# Patient Record
Sex: Female | Born: 1950 | Race: White | Hispanic: No | Marital: Married | Smoking: Never smoker
Health system: Southern US, Community
[De-identification: ages and names within clinical notes are randomized; demographics above are authoritative.]

## PROBLEM LIST (undated history)

## (undated) DIAGNOSIS — T7840XA Allergy, unspecified, initial encounter: Secondary | ICD-10-CM

## (undated) DIAGNOSIS — M1990 Unspecified osteoarthritis, unspecified site: Secondary | ICD-10-CM

## (undated) DIAGNOSIS — E079 Disorder of thyroid, unspecified: Secondary | ICD-10-CM

## (undated) DIAGNOSIS — I10 Essential (primary) hypertension: Secondary | ICD-10-CM

## (undated) HISTORY — DX: Disorder of thyroid, unspecified: E07.9

## (undated) HISTORY — DX: Essential (primary) hypertension: I10

## (undated) HISTORY — DX: Unspecified osteoarthritis, unspecified site: M19.90

## (undated) HISTORY — DX: Allergy, unspecified, initial encounter: T78.40XA

---

## 1998-07-30 ENCOUNTER — Other Ambulatory Visit: Admission: RE | Admit: 1998-07-30 | Discharge: 1998-07-30 | Payer: Self-pay | Admitting: Obstetrics and Gynecology

## 2000-01-26 ENCOUNTER — Other Ambulatory Visit: Admission: RE | Admit: 2000-01-26 | Discharge: 2000-01-26 | Payer: Self-pay | Admitting: Obstetrics and Gynecology

## 2001-05-25 ENCOUNTER — Other Ambulatory Visit: Admission: RE | Admit: 2001-05-25 | Discharge: 2001-05-25 | Payer: Self-pay | Admitting: Obstetrics and Gynecology

## 2002-06-26 ENCOUNTER — Other Ambulatory Visit: Admission: RE | Admit: 2002-06-26 | Discharge: 2002-06-26 | Payer: Self-pay | Admitting: Obstetrics and Gynecology

## 2005-07-20 ENCOUNTER — Ambulatory Visit: Payer: Self-pay | Admitting: Unknown Physician Specialty

## 2006-03-10 ENCOUNTER — Emergency Department: Payer: Self-pay | Admitting: Emergency Medicine

## 2006-09-10 IMAGING — CR DG ELBOW COMPLETE 3+V*L*
1 series · 4 of 4 positions shown · non-contrast
Comparison: none

REASON FOR EXAM: Fall, left elbow pain
COMMENTS:

PROCEDURE:     DXR - DXR ELBOW LT COMP W/OBLIQUES  - March 10, 2006  [DATE]
RESULT:     Four views of the LEFT elbow show no fracture, dislocation or
other acute bony abnormality.

[Series 1: view not recorded · 0.17mm/px · 4 of 4 slices shown]
[im 1/4]
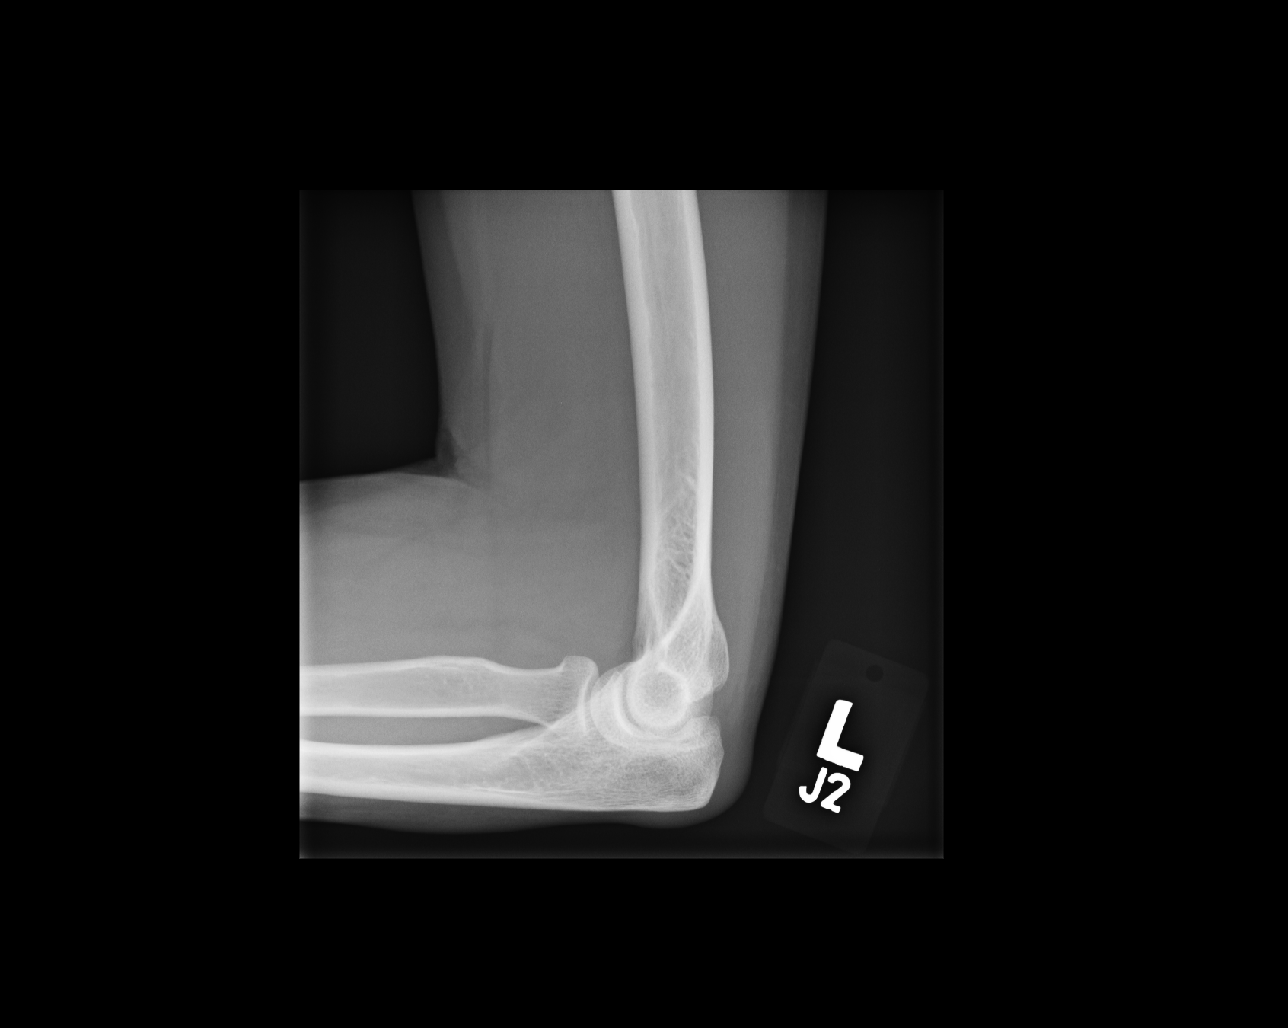
[im 2/4]
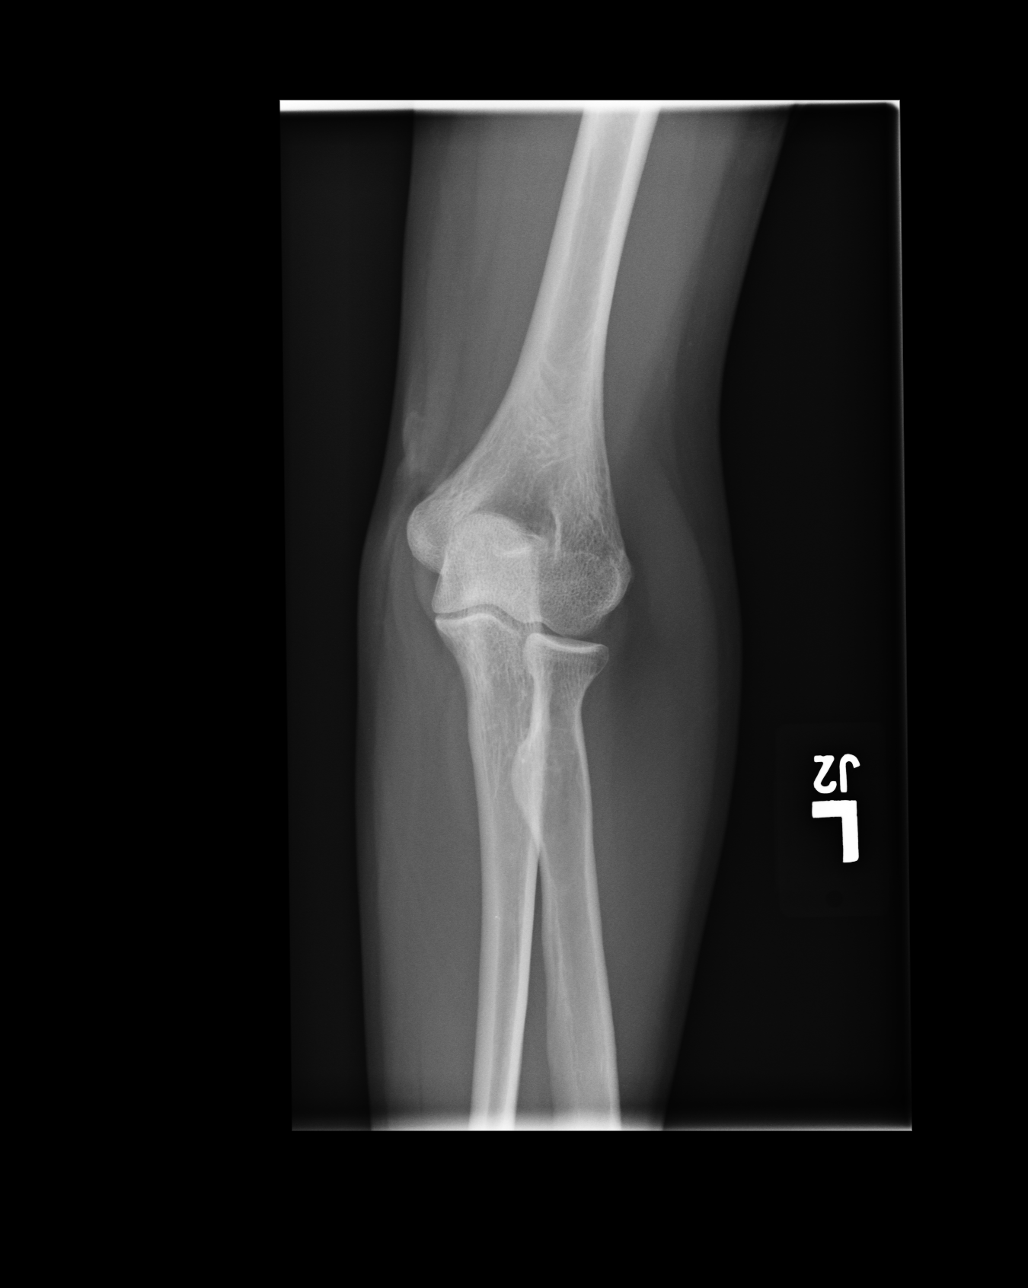
[im 3/4]
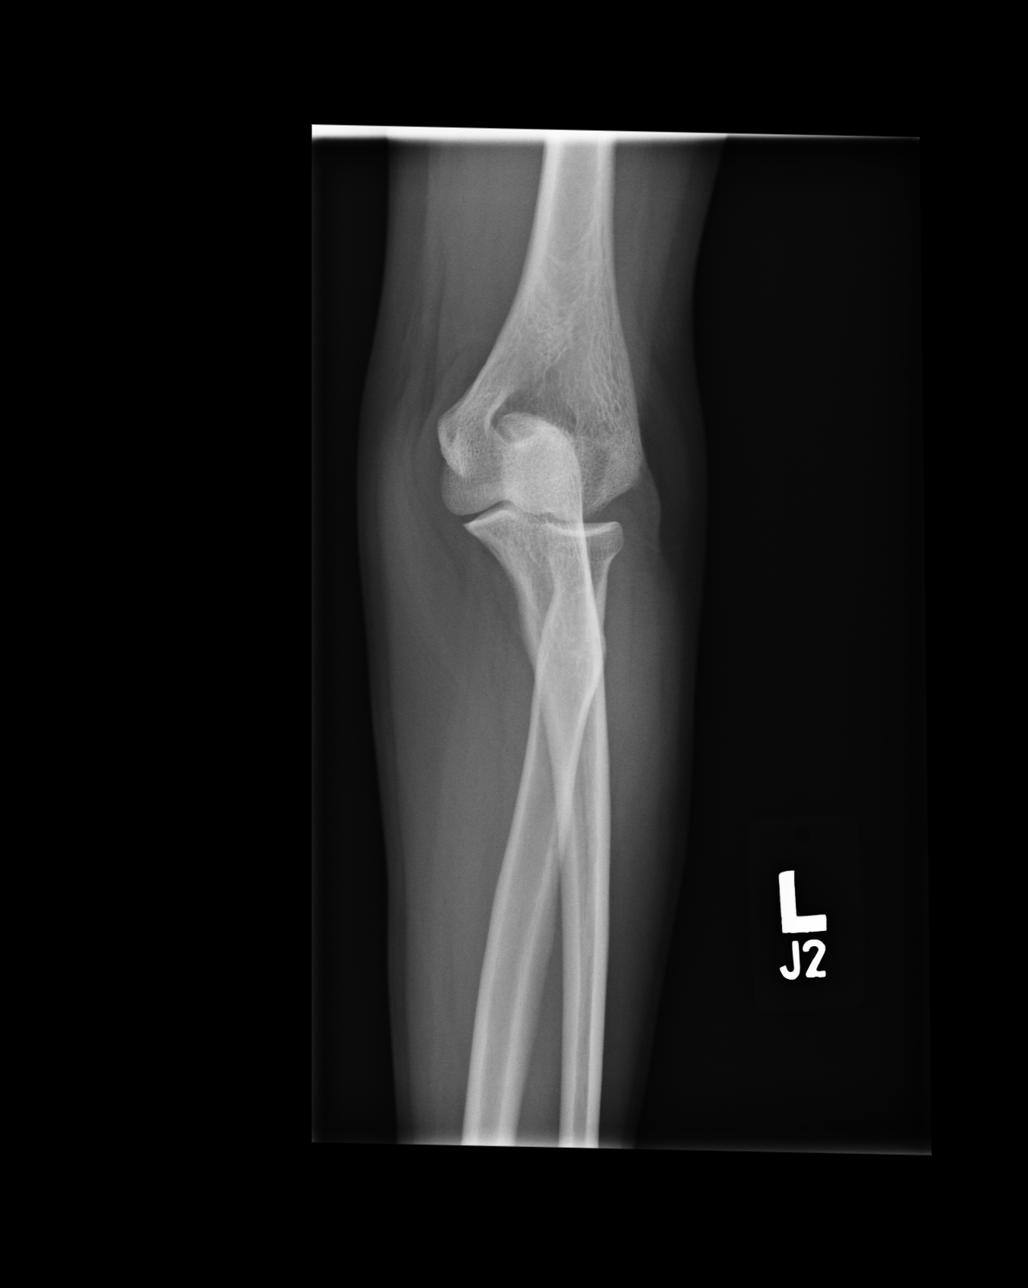
[im 4/4]
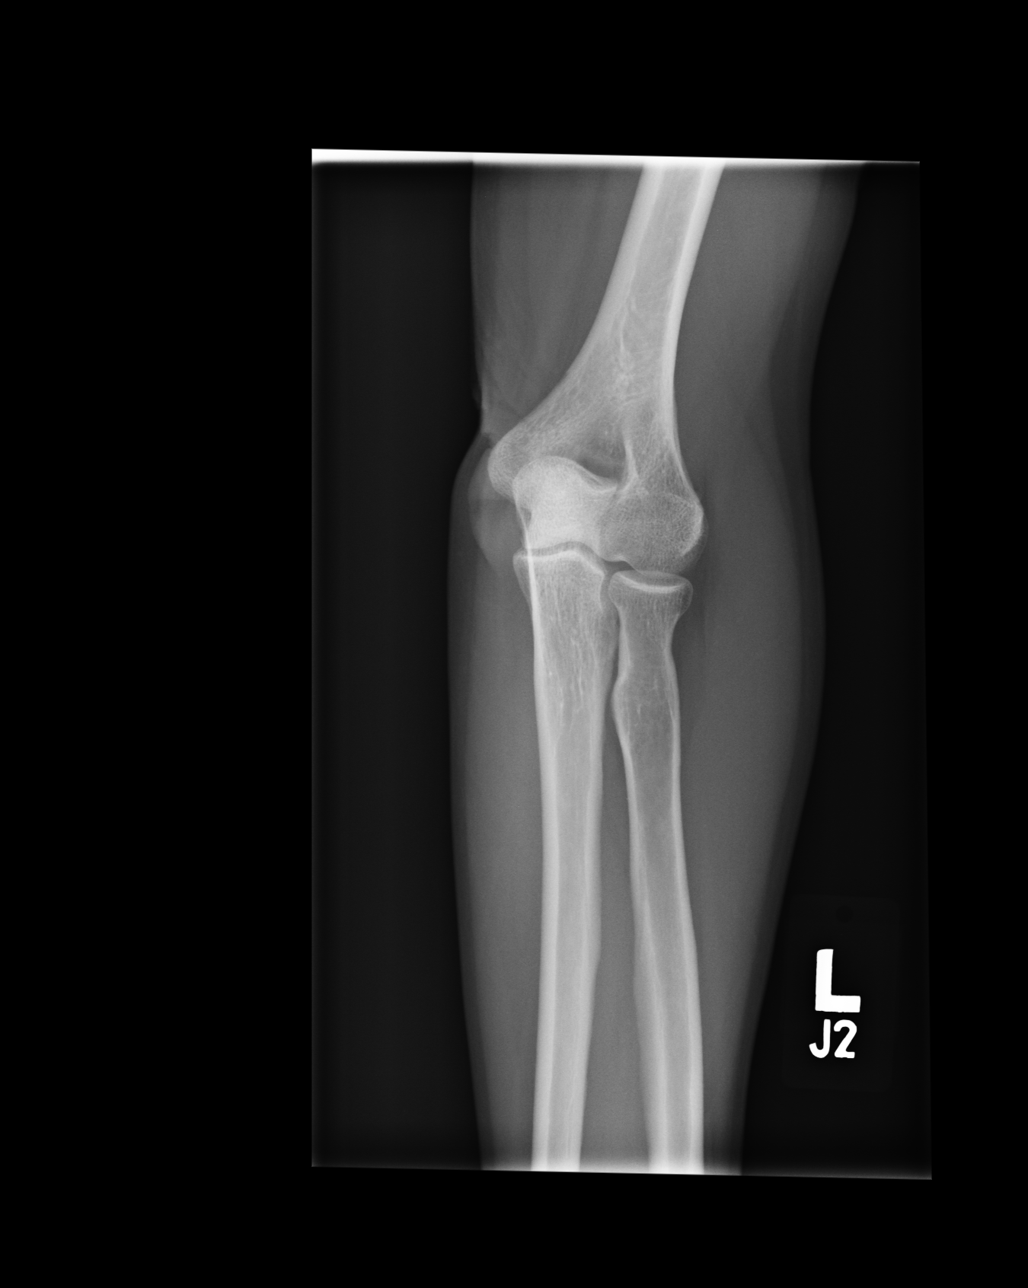

[4 of 4 positions shown; findings below may reference images not displayed]

IMPRESSION: No significant abnormalities are noted.

## 2006-09-10 IMAGING — CT CT MAXILLOFACIAL WITHOUT CONTRAST
2 series · 16 of 40 positions shown, 20 images · non-contrast
Comparison: none

REASON FOR EXAM: fall, pain, injury rm # 8
COMMENTS:

PROCEDURE:     CT  - CT MAXILLOFACIAL AREA WO  - March 10, 2006  [DATE]
RESULT:     An emergent CT scan was performed. The orbital rims and floors
appear intact. No acute fractures are noted. No fracture of the mandible is
seen. The zygomatic arches appear intact.  The sinuses appear clear.

[Series 2: facial 3.0 h60f · axial · 0.30mm/px · z∈[-162,-24]mm · 13 of 54 slices shown, 17 images]
[im 4/54  brain]
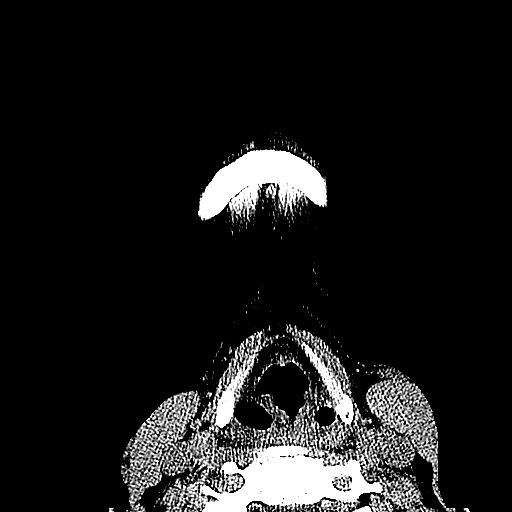
[im 4/54  bone]
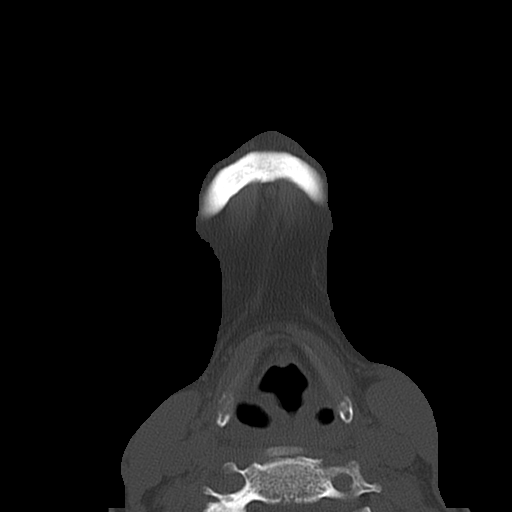
[im 8/54  bone]
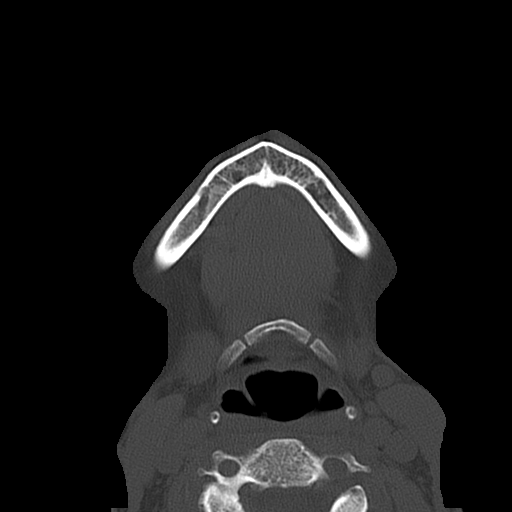
[im 11/54  bone]
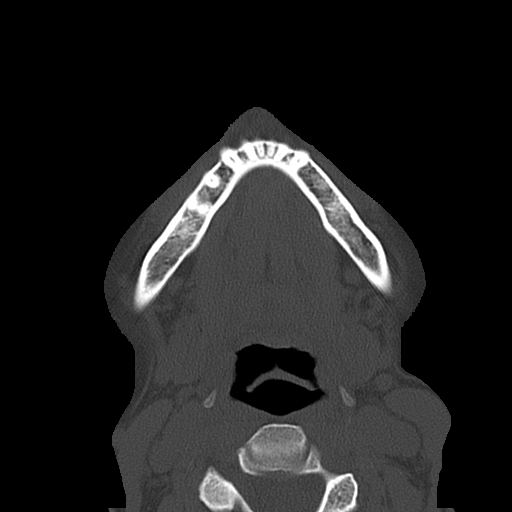
[im 15/54  bone]
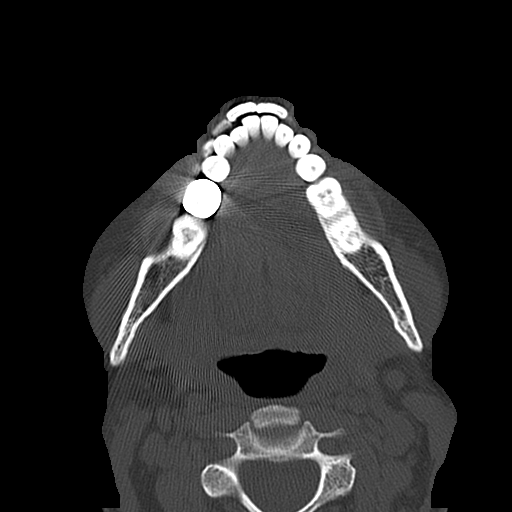
[im 19/54  brain]
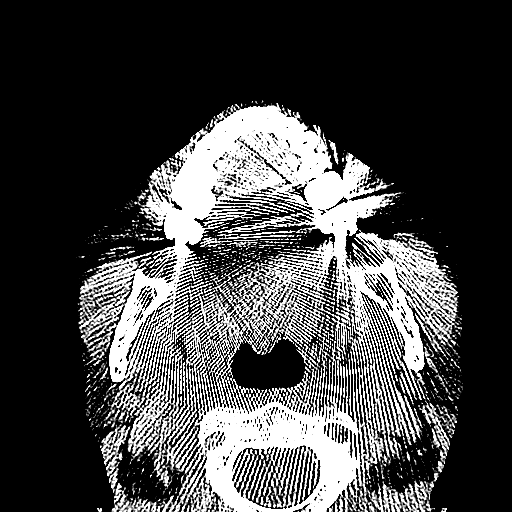
[im 19/54  bone]
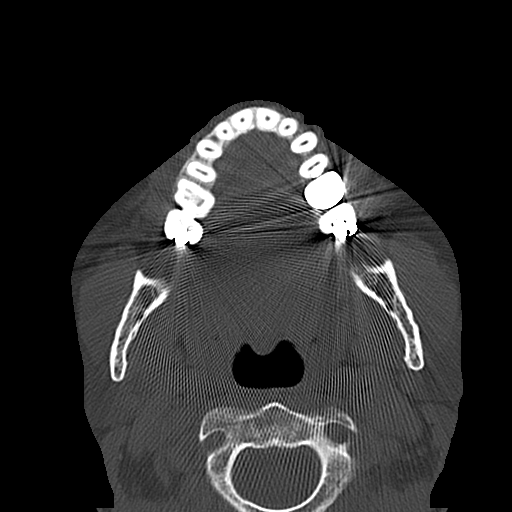
[im 22/54  bone]
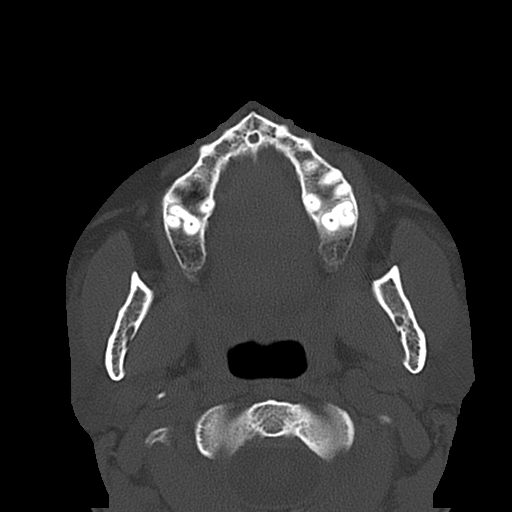
[im 28/54  bone]
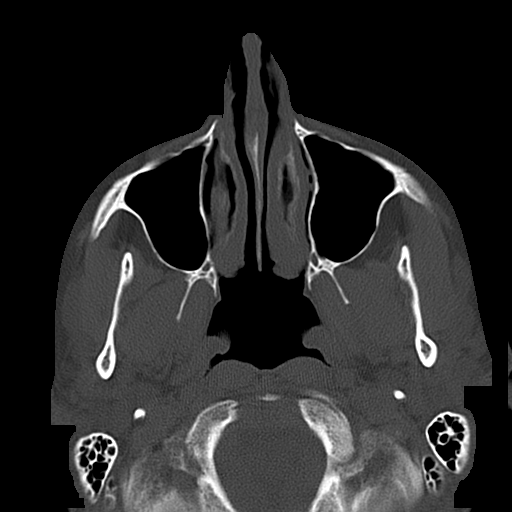
[im 32/54  bone]
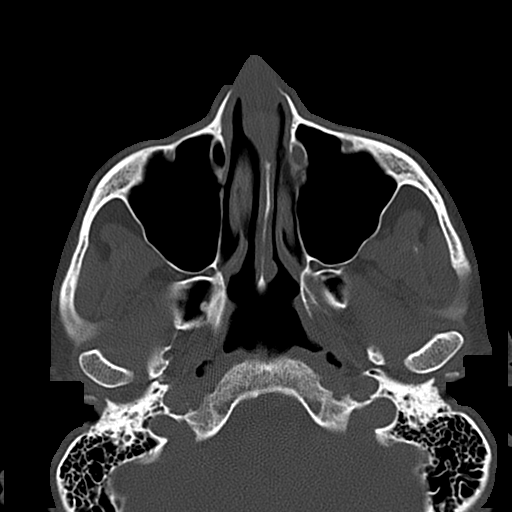
[im 35/54  brain]
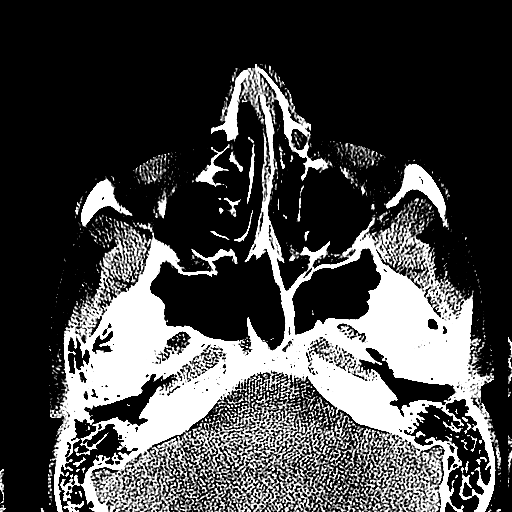
[im 35/54  bone]
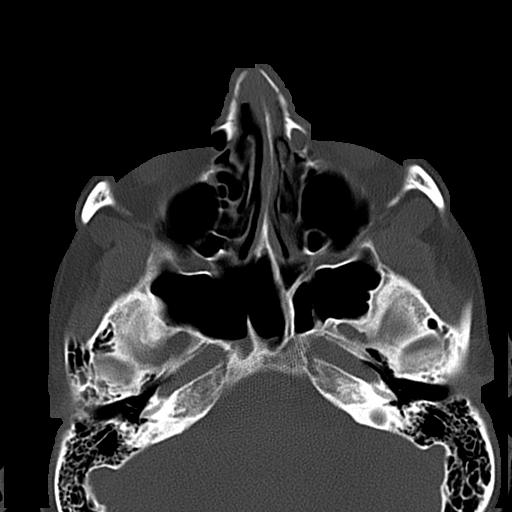
[im 39/54  bone]
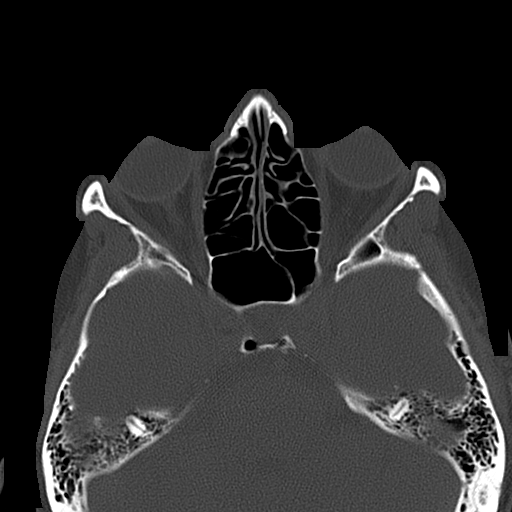
[im 43/54  bone]
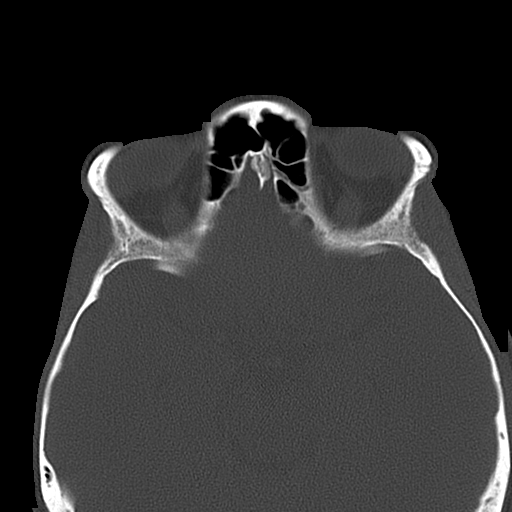
[im 46/54  bone]
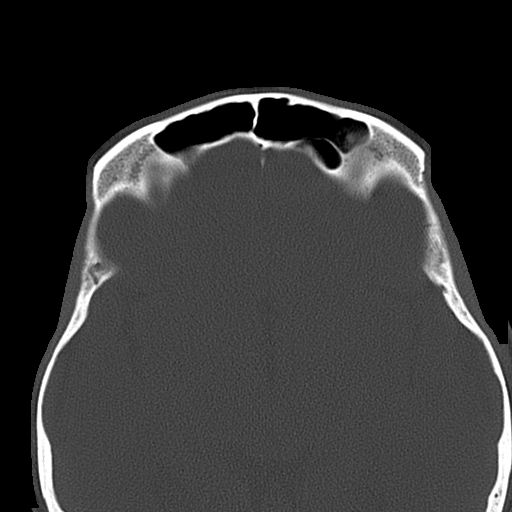
[im 50/54  brain]
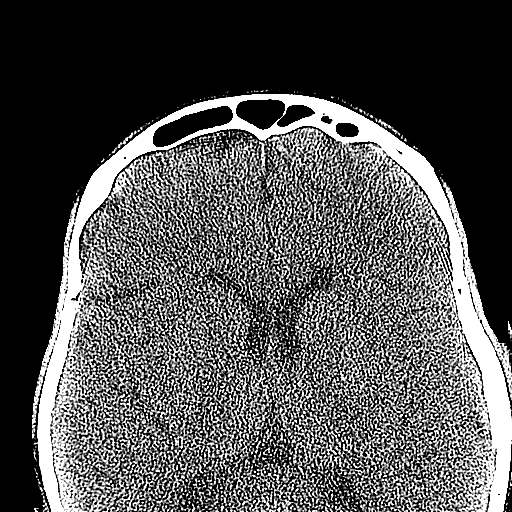
[im 50/54  bone]
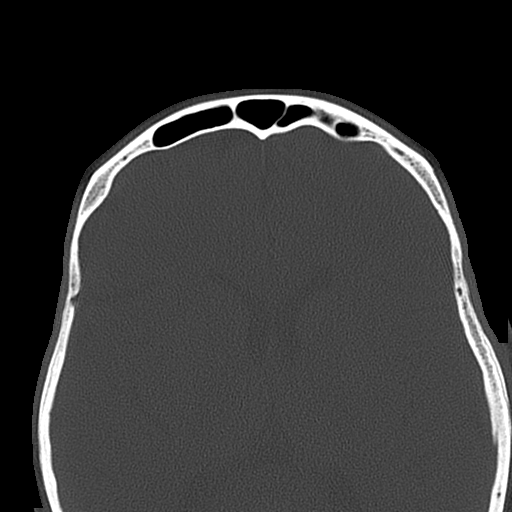

[Series 3: facial 3.0 spo · coronal · 0.33mm/px · 3 of 42 slices shown]
[im 14/42  bone]
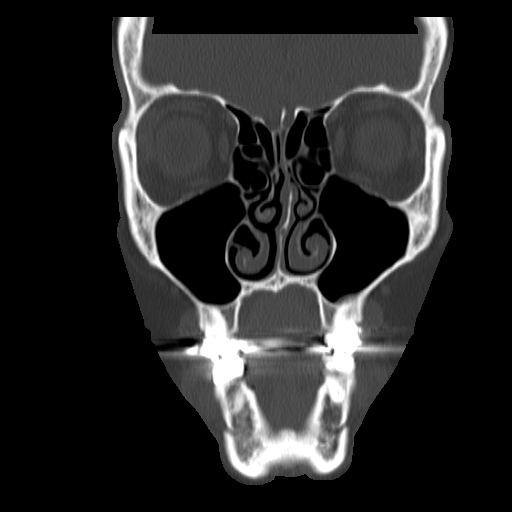
[im 19/42  bone]
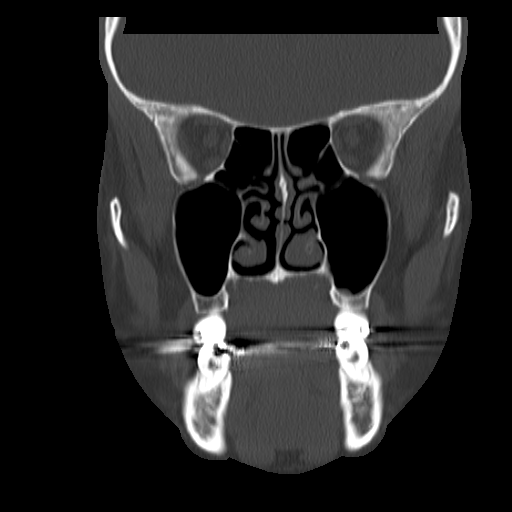
[im 23/42  bone]
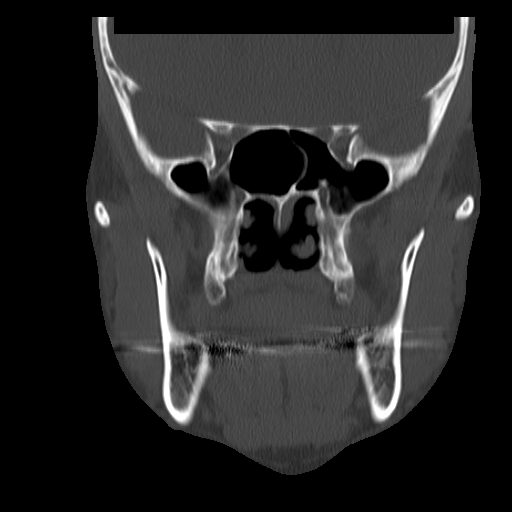

[16 of 40 positions shown; findings below may reference images not displayed]

IMPRESSION: 1)No acute fracture is noted on the facial CT.

This report was called to the [HOSPITAL] the conclusion of the
dictation.

## 2007-02-02 ENCOUNTER — Other Ambulatory Visit: Admission: RE | Admit: 2007-02-02 | Discharge: 2007-02-02 | Payer: Self-pay | Admitting: Obstetrics and Gynecology

## 2008-04-16 ENCOUNTER — Ambulatory Visit (HOSPITAL_BASED_OUTPATIENT_CLINIC_OR_DEPARTMENT_OTHER): Admission: RE | Admit: 2008-04-16 | Discharge: 2008-04-16 | Payer: Self-pay | Admitting: Obstetrics and Gynecology

## 2008-04-16 ENCOUNTER — Encounter: Payer: Self-pay | Admitting: Obstetrics and Gynecology

## 2008-04-25 ENCOUNTER — Ambulatory Visit: Payer: Self-pay | Admitting: Obstetrics and Gynecology

## 2008-04-27 ENCOUNTER — Ambulatory Visit: Payer: Self-pay | Admitting: Obstetrics and Gynecology

## 2008-05-02 ENCOUNTER — Ambulatory Visit: Payer: Self-pay | Admitting: Obstetrics and Gynecology

## 2009-03-05 ENCOUNTER — Ambulatory Visit: Payer: Self-pay | Admitting: Obstetrics and Gynecology

## 2009-03-05 ENCOUNTER — Encounter: Payer: Self-pay | Admitting: Obstetrics and Gynecology

## 2009-03-05 ENCOUNTER — Other Ambulatory Visit: Admission: RE | Admit: 2009-03-05 | Discharge: 2009-03-05 | Payer: Self-pay | Admitting: Obstetrics and Gynecology

## 2009-12-13 ENCOUNTER — Ambulatory Visit: Payer: Self-pay | Admitting: Unknown Physician Specialty

## 2010-12-30 NOTE — Op Note (Signed)
[name], [name]            ACCOUNT NO.:  [id number]   MEDICAL RECORD NO.:  [id number]          PATIENT TYPE:  AMB   LOCATION:  [facility]                         FACILITY:  [facility]   PHYSICIAN:  Daniel L. Gottsegen, M.D.DATE OF BIRTH:  1950/09/27   DATE OF PROCEDURE:  04/16/2008  DATE OF DISCHARGE:                               OPERATIVE REPORT   PREOPERATIVE DIAGNOSIS:  Postmenopausal bleeding, endometrial polyp,  possible submucous myoma.   POSTOPERATIVE DIAGNOSIS:  Postmenopausal bleeding, endometrial polyp,  possible submucous myoma.   SURGEON:  Dr. Eda Paschal   ANESTHESIA:  General.   OPERATIONS:  Hysteroscopy with excision of the intrauterine cavity  defects and endometrial sampling.   INDICATIONS:  The patient is a 60 year old gravida 2, para 2, AB 0 who  had presented at the office with postmenopausal bleeding.  A saline  infusion histogram was done the office.  There was a filling defect  coming off the anterior wall of the myoma and almost appeared as behind  the first one there was a second filling defect.  As a result of this  and her postmenopausal bleeding she comes into the hospital now for  hysteroscopy and excision of the above.   FINDINGS:  External is normal, BUS is normal.  Vaginal is normal.  Cervix is clean.  Uterus is normal size and shape with first-degree  descensus.  Adnexa failed to reveal masses.  At the time of  hysteroscopy, the patient had a very well-developed endometrial polyp  coming off the anterior wall of the fundus.  Once this was removed there  was a second mass behind it.  It appeared it either could be a firmer  endometrial polyp or possibly a small submucous myoma of about 2 cm.  Other than this, top of the fundus, tubal ostia, anterior posterior  walls of the fundus, lower uterine segment and endocervical canal were  free of disease.   PROCEDURE:  After adequate general anesthesia the patient was placed in  the dorsal lithotomy  position, prepped and draped usual sterile manner.  A single-tooth sac was placed in the anterior lip of the cervix.  The  cervix was dilated to #31 [facility] dilator and then a hysteroscopic  resectoscope was introduced without difficulty.  A 3% sorbitol was used  to expand the intrauterine cavity and a camera was used for  magnification and 90 degrees wire loop was attached with appropriate  Bovie settings.  First an endometrial polyp was encountered that was  removed in pieces with the resectoscope.  Then some endometrial  curettings were obtained as there was some buildup of endometrial from  the Megace the patient had been on.  Then the last mass which was 1-2 cm  was excised and the surgeon could not be sure  from the gross appearance  whether it was a myoma or another endometrial polyp that was firmer.  Following all this the cavity was completely clean.  There was some  bleeding points that were cauterized and the procedure was terminated.  Fluid deficit was 170 mL.  Blood loss was minimal.   The following specimens were sent  to pathology for tissue diagnosis.  1. Endometrial polyp.  2. Endometrial curettings.  3. Submucous myoma versus endometrial polyp.  The patient left the      operating room in satisfactory condition.      Daniel L. Eda Paschal, M.D.  Electronically Signed     DLG/MEDQ  D:  04/16/2008  T:  04/16/2008  Job:  [id number]

## 2011-12-22 LAB — HM PAP SMEAR: HM Pap smear: NORMAL

## 2012-01-05 ENCOUNTER — Ambulatory Visit: Payer: Self-pay

## 2012-06-23 ENCOUNTER — Encounter: Payer: Self-pay | Admitting: Internal Medicine

## 2012-06-23 ENCOUNTER — Ambulatory Visit (INDEPENDENT_AMBULATORY_CARE_PROVIDER_SITE_OTHER): Payer: BC Managed Care – PPO | Admitting: Internal Medicine

## 2012-06-23 VITALS — BP 140/80 | HR 62 | Temp 98.2°F | Ht 63.0 in | Wt 129.2 lb

## 2012-06-23 DIAGNOSIS — R1032 Left lower quadrant pain: Secondary | ICD-10-CM | POA: Insufficient documentation

## 2012-06-23 DIAGNOSIS — R1031 Right lower quadrant pain: Secondary | ICD-10-CM

## 2012-06-23 DIAGNOSIS — M25549 Pain in joints of unspecified hand: Secondary | ICD-10-CM | POA: Insufficient documentation

## 2012-06-23 DIAGNOSIS — E039 Hypothyroidism, unspecified: Secondary | ICD-10-CM

## 2012-06-23 DIAGNOSIS — F411 Generalized anxiety disorder: Secondary | ICD-10-CM | POA: Insufficient documentation

## 2012-06-23 DIAGNOSIS — F419 Anxiety disorder, unspecified: Secondary | ICD-10-CM | POA: Insufficient documentation

## 2012-06-23 MED ORDER — ZOSTER VACCINE LIVE 19400 UNT/0.65ML ~~LOC~~ SOLR: 0.6500 mL | Freq: Once | SUBCUTANEOUS | Status: DC

## 2012-06-23 NOTE — Assessment & Plan Note (Signed)
Symptoms of anxiety are well controlled with Zoloft and Xanax. We'll plan to continue.

## 2012-06-23 NOTE — Assessment & Plan Note (Signed)
Intermittent mild lower abdominal pain occurring typically twice per month. Exam is normal today. Recent Pap smear and colonoscopy were both normal. Question if this may be related to ovarian pathology. Recommended pelvic ultrasound for further evaluation. Patient would like to think about this but will call or e-mail she would like to proceed with ultrasound.

## 2012-06-23 NOTE — Assessment & Plan Note (Signed)
Will get records on previous TSH drawn with physical exam in May 2013. Continue Synthroid.

## 2012-06-23 NOTE — Progress Notes (Signed)
Subjective:    Patient ID: Sandra [name], female    DOB: 09-15-1950, 61 y.o.   MRN: [id number]  HPI 61 year old female with history of anxiety, hypothyroidism presents to establish care. She reports that she is generally feeling well. She reports that symptoms of anxiety have been ongoing for years and related to stress with medical issues of both of her sons, both who had ITP and her oldest who had bacterial meningitis. She continues to feel some anxiety related to their overall well-being and concern for their care. However, she feels that symptoms are well controlled with Zoloft and Xanax as needed.  In regards to her hypothyroidism, she reports that recent TSH drawn in May of 2013 was normal. She reports full compliance with her Synthroid.  She notes a several year history of pain in the joints of her hands. This is described as aching and stiffness which is most prominent in the mornings and then improves throughout the day. She has been taking Mobic for several years with some improvement. She denies any pain in other joints. She does occasionally have tingling in her thumb and first 2 fingers on both of her hands and this was diagnosed as carpal tunnel syndrome. She wears wrist brace at night with some improvement in this.  She also notes several month history of intermittent episodes of dull mild lower abdominal pain. This typically occurs twice per month and last about one day on each occurrence. It is not associated with dysuria, hematuria, fever, chills, abdominal cramping, diarrhea, constipation. She denies any nausea or vomiting. She had a recent Pap smear in May of 2013 which was normal. Her colonoscopy is also up to date and was normal. She has not taken any medication for this.  Outpatient Encounter Prescriptions as of 06/23/2012  Medication Sig Dispense Refill  . ALPRAZolam (XANAX) 0.25 MG tablet Take 1/2 to 1 tablet daily if needed for anxiety or insomia      . fluticasone  (FLONASE) 50 MCG/ACT nasal spray Place 2 sprays into the nose daily.      Marland Kitchen levothyroxine (SYNTHROID, LEVOTHROID) 88 MCG tablet Take 88 mcg by mouth daily.      Marland Kitchen losartan (COZAAR) 100 MG tablet Take 100 mg by mouth daily.      . meloxicam (MOBIC) 15 MG tablet Take 15 mg by mouth daily.      . sertraline (ZOLOFT) 50 MG tablet Take 50 mg by mouth daily.      Marland Kitchen zoster vaccine live, PF, (ZOSTAVAX) 40981 UNT/0.65ML injection Inject 19,400 Units into the skin once.  1 each  0   BP 140/80  Pulse 62  Temp 98.2 F (36.8 C) (Oral)  Ht 5\' 3"  (1.6 m)  Wt 129 lb 4 oz (58.627 kg)  BMI 22.90 kg/m2  SpO2 99%  Review of Systems  Constitutional: Negative for fever, chills, appetite change, fatigue and unexpected weight change.  HENT: Negative for ear pain, congestion, sore throat, trouble swallowing, neck pain, voice change and sinus pressure.   Eyes: Negative for visual disturbance.  Respiratory: Negative for cough, shortness of breath, wheezing and stridor.   Cardiovascular: Negative for chest pain, palpitations and leg swelling.  Gastrointestinal: Positive for abdominal pain. Negative for nausea, vomiting, diarrhea, constipation, blood in stool, abdominal distention and anal bleeding.  Genitourinary: Negative for dysuria and flank pain.  Musculoskeletal: Positive for arthralgias. Negative for myalgias and gait problem.  Skin: Negative for color change and rash.  Neurological: Negative for dizziness and headaches.  Hematological: Negative for adenopathy. Does not bruise/bleed easily.  Psychiatric/Behavioral: Negative for suicidal ideas, sleep disturbance and dysphoric mood. The patient is nervous/anxious.        Objective:   Physical Exam  Constitutional: She is oriented to person, place, and time. She appears well-developed and well-nourished. No distress.  HENT:  Head: Normocephalic and atraumatic.  Right Ear: External ear normal.  Left Ear: External ear normal.  Nose: Nose normal.    Mouth/Throat: Oropharynx is clear and moist. No oropharyngeal exudate.  Eyes: Conjunctivae normal are normal. Pupils are equal, round, and reactive to light. Right eye exhibits no discharge. Left eye exhibits no discharge. No scleral icterus.  Neck: Normal range of motion. Neck supple. No tracheal deviation present. No thyromegaly present.  Cardiovascular: Normal rate, regular rhythm, normal heart sounds and intact distal pulses.  Exam reveals no gallop and no friction rub.   No murmur heard. Pulmonary/Chest: Effort normal and breath sounds normal. No respiratory distress. She has no wheezes. She has no rales. She exhibits no tenderness.  Abdominal: Soft. Bowel sounds are normal. She exhibits no distension and no mass. There is no tenderness. There is no rebound and no guarding.  Musculoskeletal: Normal range of motion. She exhibits no edema and no tenderness.  Lymphadenopathy:    She has no cervical adenopathy.  Neurological: She is alert and oriented to person, place, and time. No cranial nerve deficit. She exhibits normal muscle tone. Coordination normal.  Skin: Skin is warm and dry. No rash noted. She is not diaphoretic. No erythema. No pallor.  Psychiatric: Her behavior is normal. Judgment and thought content normal. Her mood appears anxious.          Assessment & Plan:

## 2012-06-23 NOTE — Assessment & Plan Note (Signed)
Patient reports several year history of pain in the joints of her hands which is worse in the morning and then improves throughout the day. Also improves with use of Mobic. We discussed potential evaluation for rheumatoid arthritis given that symptoms and exam are most consistent with this. She would like to hold off and have labs performed with her physical exam in May of 2014.

## 2012-07-07 IMAGING — CR DG CHEST 2V
1 series · 2 of 2 positions shown · non-contrast
Comparison: none

REASON FOR EXAM: cough
COMMENTS:

[Series 1: pa · 0.17mm/px · 2 of 2 slices shown]
[im 1/2]
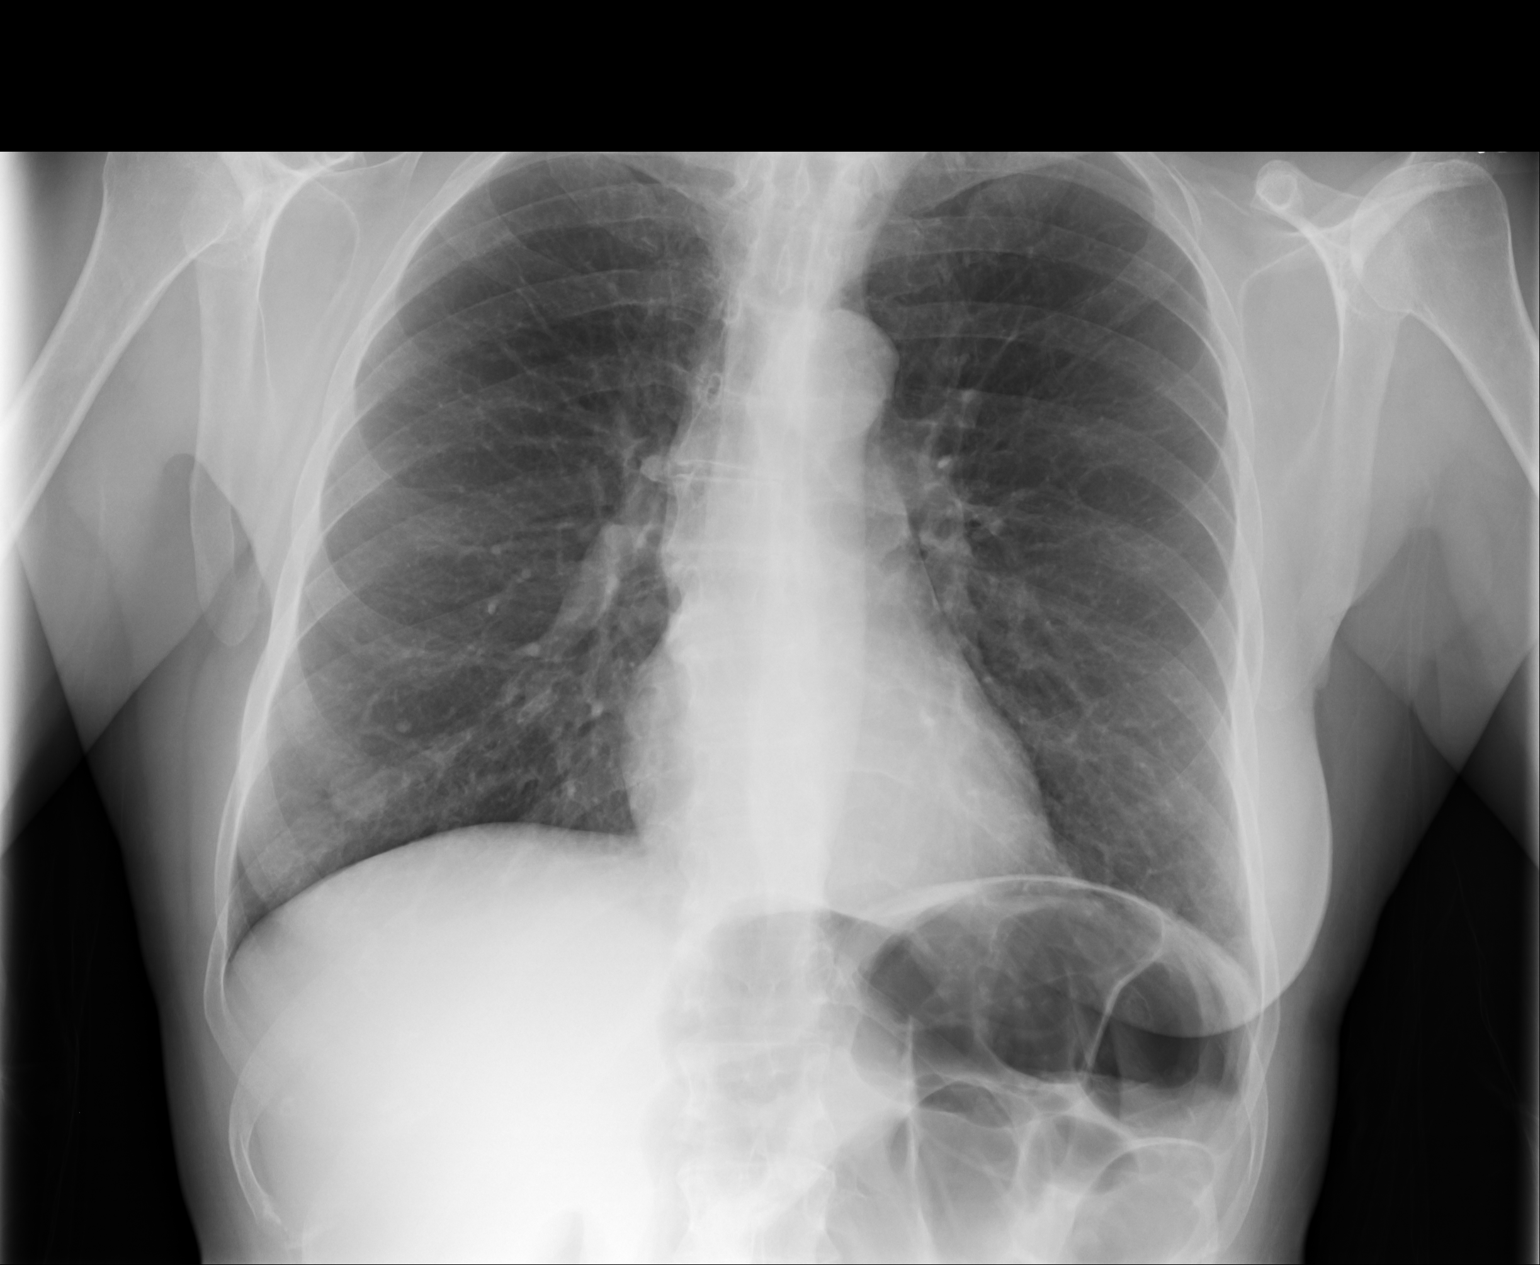
[im 2/2]
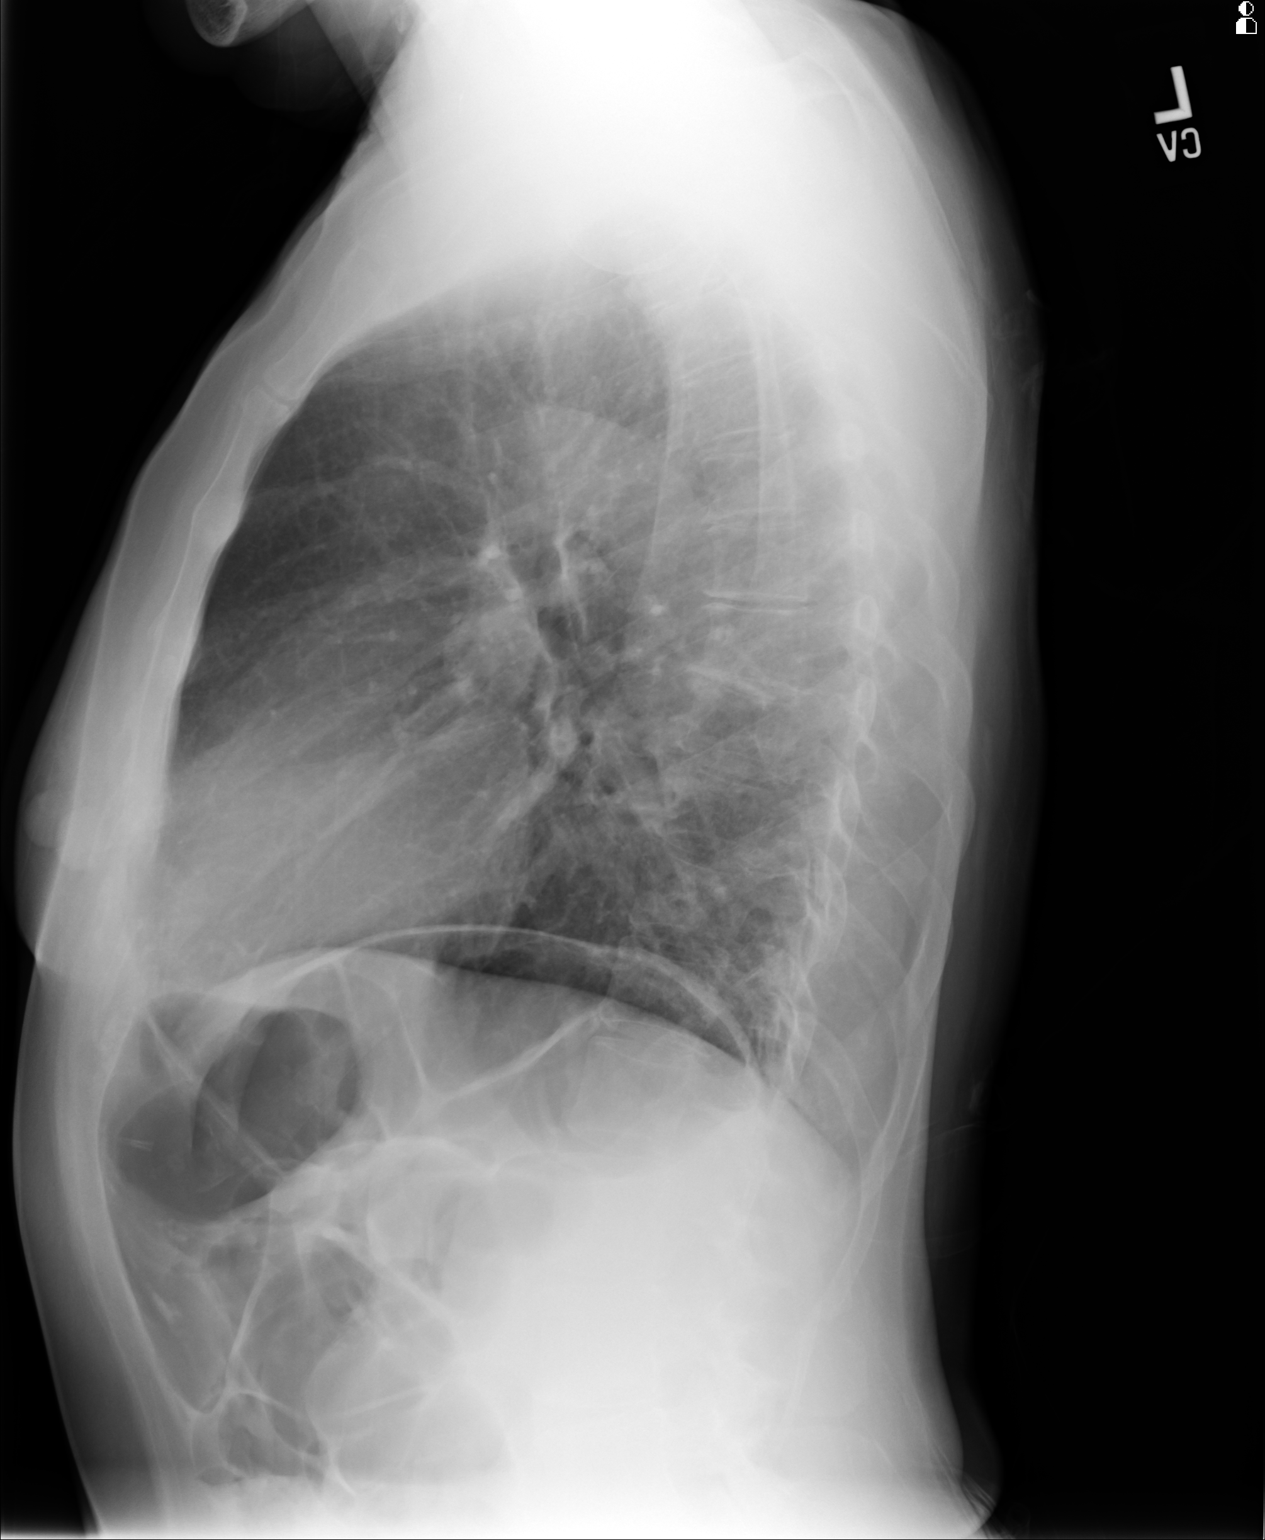

[2 of 2 positions shown; findings below may reference images not displayed]

PROCEDURE:     KDR - KDXR CHEST PA (OR AP) AND LAT  - January 05, 2012  [DATE]

RESULT:     There is a moderate amount of air within loops of what appear to
be colon under the left side of the diaphragm. If the patient has abdominal
symptoms further imaging may be beneficial.

The lungs are clear. The heart and pulmonary vessels are normal. The bony
and mediastinal structures are unremarkable. There is no effusion. There is
no pneumothorax or evidence of congestive failure.
IMPRESSION: No acute cardiopulmonary disease.

[REDACTED]

## 2012-08-30 ENCOUNTER — Other Ambulatory Visit: Payer: Self-pay | Admitting: Internal Medicine

## 2012-08-30 MED ORDER — LOSARTAN POTASSIUM 100 MG PO TABS: 100.0000 mg | ORAL_TABLET | Freq: Every day | ORAL | Status: DC

## 2012-08-30 NOTE — Telephone Encounter (Signed)
losartan (COZAAR) 100 MG tablet  # 30

## 2012-08-31 ENCOUNTER — Other Ambulatory Visit: Payer: Self-pay | Admitting: Family Medicine

## 2012-09-03 ENCOUNTER — Other Ambulatory Visit: Payer: Self-pay | Admitting: Family Medicine

## 2012-09-03 NOTE — Telephone Encounter (Signed)
Please pull paper chart.  

## 2012-09-05 ENCOUNTER — Other Ambulatory Visit: Payer: Self-pay | Admitting: Internal Medicine

## 2012-09-05 NOTE — Telephone Encounter (Addendum)
ALPRAZolam (XANAX) 0.25 MG tablet #30  losartan (COZAAR) 100 MG tablet #30  meloxicam (MOBIC) 15 MG tablet #30

## 2012-09-05 NOTE — Telephone Encounter (Signed)
Patient does not have a [facility] account. May be a  patient of Dr. Michaelle Copas.

## 2012-09-06 MED ORDER — LOSARTAN POTASSIUM 100 MG PO TABS: 100.0000 mg | ORAL_TABLET | Freq: Every day | ORAL | Status: DC

## 2012-09-06 MED ORDER — ALPRAZOLAM 0.25 MG PO TABS: ORAL_TABLET | ORAL | Status: DC

## 2012-09-06 MED ORDER — MELOXICAM 15 MG PO TABS: 15.0000 mg | ORAL_TABLET | Freq: Every day | ORAL | Status: DC

## 2012-09-08 NOTE — Telephone Encounter (Signed)
Rx called to pharmacy as instructed. 

## 2012-09-30 ENCOUNTER — Other Ambulatory Visit: Payer: Self-pay | Admitting: Family Medicine

## 2012-10-03 ENCOUNTER — Other Ambulatory Visit: Payer: Self-pay | Admitting: *Deleted

## 2012-10-03 NOTE — Telephone Encounter (Signed)
Patient has not been seen in an year, please advise

## 2012-10-03 NOTE — Telephone Encounter (Signed)
May refill x 1 month. Needs a follow up visit for further refills.

## 2012-10-04 MED ORDER — SERTRALINE HCL 50 MG PO TABS: 50.0000 mg | ORAL_TABLET | Freq: Every day | ORAL | Status: DC

## 2012-10-04 NOTE — Telephone Encounter (Signed)
Rx escribed to pharmacy on file with notation for patient to call office to schedule an appointment.

## 2012-10-10 ENCOUNTER — Other Ambulatory Visit: Payer: Self-pay | Admitting: *Deleted

## 2012-10-10 MED ORDER — FLUTICASONE PROPIONATE 50 MCG/ACT NA SUSP: 2.0000 | Freq: Every day | NASAL | Status: DC

## 2012-10-31 ENCOUNTER — Other Ambulatory Visit: Payer: Self-pay | Admitting: Internal Medicine

## 2012-10-31 NOTE — Telephone Encounter (Signed)
This patient was seen by you in 06/2012, however she has an upcoming appointment with Dr. Lorin Picket in May

## 2012-10-31 NOTE — Telephone Encounter (Signed)
Can you see why this pt is changing providers? Was this on purpose? Dr. Lorin Picket was not aware, and pt was not former pt of hers.

## 2012-11-02 ENCOUNTER — Telehealth: Payer: Self-pay | Admitting: *Deleted

## 2012-11-04 NOTE — Telephone Encounter (Signed)
Addressed in another Rx encounter

## 2012-11-10 ENCOUNTER — Other Ambulatory Visit: Payer: Self-pay | Admitting: *Deleted

## 2012-11-15 MED ORDER — SERTRALINE HCL 50 MG PO TABS: 50.0000 mg | ORAL_TABLET | Freq: Every day | ORAL | Status: DC

## 2012-11-23 ENCOUNTER — Encounter: Payer: Self-pay | Admitting: Internal Medicine

## 2012-12-16 ENCOUNTER — Other Ambulatory Visit: Payer: Self-pay | Admitting: Internal Medicine

## 2012-12-21 ENCOUNTER — Encounter: Payer: BC Managed Care – PPO | Admitting: Internal Medicine

## 2012-12-28 ENCOUNTER — Encounter: Payer: BC Managed Care – PPO | Admitting: Internal Medicine

## 2012-12-30 ENCOUNTER — Other Ambulatory Visit: Payer: Self-pay | Admitting: *Deleted

## 2012-12-30 MED ORDER — LEVOTHYROXINE SODIUM 88 MCG PO TABS: 88.0000 ug | ORAL_TABLET | Freq: Every day | ORAL | Status: DC

## 2012-12-30 NOTE — Telephone Encounter (Signed)
Eprescribed.

## 2013-01-16 ENCOUNTER — Encounter: Payer: BC Managed Care – PPO | Admitting: Internal Medicine

## 2013-01-30 ENCOUNTER — Other Ambulatory Visit: Payer: Self-pay | Admitting: Internal Medicine

## 2013-02-20 ENCOUNTER — Encounter: Payer: Self-pay | Admitting: Family Medicine

## 2013-03-10 ENCOUNTER — Other Ambulatory Visit: Payer: Self-pay | Admitting: *Deleted

## 2013-03-10 MED ORDER — LEVOTHYROXINE SODIUM 88 MCG PO TABS: 88.0000 ug | ORAL_TABLET | Freq: Every day | ORAL | Status: DC

## 2013-03-10 NOTE — Telephone Encounter (Signed)
Eprescribed.

## 2013-03-13 ENCOUNTER — Ambulatory Visit (INDEPENDENT_AMBULATORY_CARE_PROVIDER_SITE_OTHER): Payer: BC Managed Care – PPO | Admitting: Family Medicine

## 2013-03-13 ENCOUNTER — Other Ambulatory Visit (HOSPITAL_COMMUNITY)
Admission: RE | Admit: 2013-03-13 | Discharge: 2013-03-13 | Disposition: A | Discharge status: Home / Self Care | Payer: BC Managed Care – PPO | Source: Ambulatory Visit | Attending: Family Medicine | Admitting: Family Medicine

## 2013-03-13 ENCOUNTER — Encounter: Payer: Self-pay | Admitting: Family Medicine

## 2013-03-13 VITALS — BP 144/94 | HR 60 | Temp 97.7°F | Ht 63.25 in | Wt 126.0 lb

## 2013-03-13 DIAGNOSIS — E039 Hypothyroidism, unspecified: Secondary | ICD-10-CM

## 2013-03-13 DIAGNOSIS — F411 Generalized anxiety disorder: Secondary | ICD-10-CM

## 2013-03-13 DIAGNOSIS — M25549 Pain in joints of unspecified hand: Secondary | ICD-10-CM

## 2013-03-13 DIAGNOSIS — Z136 Encounter for screening for cardiovascular disorders: Secondary | ICD-10-CM

## 2013-03-13 DIAGNOSIS — Z01419 Encounter for gynecological examination (general) (routine) without abnormal findings: Secondary | ICD-10-CM | POA: Insufficient documentation

## 2013-03-13 DIAGNOSIS — R1032 Left lower quadrant pain: Secondary | ICD-10-CM

## 2013-03-13 DIAGNOSIS — R1031 Right lower quadrant pain: Secondary | ICD-10-CM

## 2013-03-13 DIAGNOSIS — Z1151 Encounter for screening for human papillomavirus (HPV): Secondary | ICD-10-CM | POA: Insufficient documentation

## 2013-03-13 DIAGNOSIS — F419 Anxiety disorder, unspecified: Secondary | ICD-10-CM

## 2013-03-13 DIAGNOSIS — Z0000 Encounter for general adult medical examination without abnormal findings: Secondary | ICD-10-CM

## 2013-03-13 LAB — LIPID PANEL
Total CHOL/HDL Ratio: 3
Triglycerides: 96 mg/dL (ref 0.0–149.0)

## 2013-03-13 LAB — COMPREHENSIVE METABOLIC PANEL
ALT: 15 U/L (ref 0–35)
Albumin: 4.5 g/dL (ref 3.5–5.2)
Alkaline Phosphatase: 83 U/L (ref 39–117)
CO2: 27 mEq/L (ref 19–32)
GFR: 93.53 mL/min (ref >60.00)
Potassium: 4.3 mEq/L (ref 3.5–5.1)
Sodium: 136 mEq/L (ref 135–145)
Total Bilirubin: 0.9 mg/dL (ref 0.3–1.2)
Total Protein: 7.7 g/dL (ref 6.0–8.3)

## 2013-03-13 MED ORDER — SERTRALINE HCL 25 MG PO TABS: 25.0000 mg | ORAL_TABLET | Freq: Every day | ORAL | Status: DC

## 2013-03-13 NOTE — Patient Instructions (Addendum)
It was nice to meet you. We will call you with your lab and pap smear results. You can also view them online with my chart.

## 2013-03-13 NOTE — Progress Notes (Signed)
Subjective:    Patient ID: Sandra [name], female    DOB: 12-15-1950, 62 y.o.   MRN: [id number]  HPI Very pleasant 62 year old female with history of anxiety, hypothyroidism here to establish care and for CPX.  G2P2- no h/o abnormal pap smears.  Last pap smear ? 3 years ago. LMP > 8 years ago.  No h/o post menopausal bleeding.  Does have some vaginal dryness.  No insomnia or hot flashes. Just had mammogram a week or two ago. Colonoscopy UTD ( Dr. Mechele Collin) in 2011.  Established care withJennifer Walker in 06/2012 and would like to transfer care here.  Anxiety- Feels anxiety has been well controlled with current dose of Zoloft and as needed Xanax.  She reports that symptoms of anxiety have been ongoing for years and related to stress with medical issues of her parents and both of her sons, both who had ITP and her oldest who had bacterial meningitis. She actually would like to try to decrease dose of her Zoloft if possible.  Hypothyroidism- On synthroid 88 mcg daily.  Denies any symptoms of hypo or hyperthyroidism. No results found for this basename: TSH   Joint pain in her hands- She notes a several year history of pain in the joints of her hands. This is described as aching and stiffness which is most prominent in the mornings and then improves throughout the day. She has been taking Mobic with relief of symptoms.  She denies involvement of her other joints.  Very active- walks on treadmill for 30-45 minutes, 4 times a week. Does have family h/o arthritis (?OA).    Outpatient Encounter Prescriptions as of 03/13/2013  Medication Sig Dispense Refill  . ALPRAZolam (XANAX) 0.25 MG tablet Take 1/2 to 1 tablet daily if needed for anxiety or insomia  30 tablet  3  . fluticasone (FLONASE) 50 MCG/ACT nasal spray USE ONE TO TWO SPRAYS IN EACH NOSTRIL EVERYDAY IF NEEDED  16 g  3  . levothyroxine (SYNTHROID, LEVOTHROID) 88 MCG tablet Take 1 tablet (88 mcg total) by mouth daily.  30 tablet  0   . losartan (COZAAR) 100 MG tablet Take 1 tablet (100 mg total) by mouth daily.  30 tablet  6  . meloxicam (MOBIC) 15 MG tablet TAKE 1 TABLET EVERY DAY  30 tablet  3  . sertraline (ZOLOFT) 50 MG tablet Take 1 tablet (50 mg total) by mouth daily.  30 tablet  3  . zoster vaccine live, PF, (ZOSTAVAX) 66440 UNT/0.65ML injection Inject 19,400 Units into the skin once.  1 each  0   No facility-administered encounter medications on file as of 03/13/2013.    Review of Systems  Patient reports no  vision/ hearing changes,anorexia, weight change, fever ,adenopathy, persistant / recurrent hoarseness, swallowing issues, chest pain, edema,persistant / recurrent cough, hemoptysis, dyspnea(rest, exertional, paroxysmal nocturnal), gastrointestinal  bleeding (melena, rectal bleeding), abdominal pain, excessive heart burn, GU symptoms(dysuria, hematuria, pyuria, voiding/incontinence  Issues) syncope, focal weakness, severe memory loss, concerning skin lesions, depression, anxiety, abnormal bruising/bleeding, major joint swelling, breast masses or abnormal vaginal bleeding.         Objective:   Physical Exam  BP 144/94  Pulse 60  Temp(Src) 97.7 F (36.5 C)  Ht 5' 3.25" (1.607 m)  Wt 126 lb (57.153 kg)  BMI 22.13 kg/m2  General:  Well-developed,well-nourished,in no acute distress; alert,appropriate and cooperative throughout examination Head:  normocephalic and atraumatic.   Eyes:  vision grossly intact, pupils equal, pupils round, and pupils reactive to light.  Ears:  R ear normal and L ear normal.   Nose:  no external deformity.   Mouth:  good dentition.   Neck:  No deformities, masses, or tenderness noted. Lungs:  Normal respiratory effort, chest expands symmetrically. Lungs are clear to auscultation, no crackles or wheezes. Heart:  Normal rate and regular rhythm. S1 and S2 normal without gallop, murmur, click, rub or other extra sounds. Abdomen:  Bowel sounds positive,abdomen soft and non-tender  without masses, organomegaly or hernias noted. Rectal:  no external abnormalities.   Genitalia:  Pelvic Exam:        External: normal female genitalia without lesions or masses        Vagina: normal without lesions or masses        Cervix: normal without lesions or masses        Adnexa: normal bimanual exam without masses or fullness        Uterus: normal by palpation        Pap smear: performed Msk:  No deformity or scoliosis noted of thoracic or lumbar spine.   Extremities:  No clubbing, cyanosis, edema, or deformity noted with normal full range of motion of all joints.   Neurologic:  alert & oriented X3 and gait normal.   Skin:  Intact without suspicious lesions or rashes Cervical Nodes:  No lymphadenopathy noted Axillary Nodes:  No palpable lymphadenopathy Psych:  Cognition and judgment appear intact. Alert and cooperative with normal attention span and concentration. No apparent delusions, illusions, hallucinations      Assessment & Plan:  1. Hypothyroidism Continue current dose of synthroid.  Asymptomatic. Recheck thyroid function today. - TSH - T4, Free  2. Anxiety Stable.  Will decrease Zoloft to 25 mg daily, continue prn xanax.  3. Screening for ischemic heart disease  - Lipid Profile  4. Routine general medical examination at a health care facility Reviewed preventive care protocols, scheduled due services, and updated immunizations Discussed nutrition, exercise, diet, and healthy lifestyle.  - Comprehensive metabolic panel - Cytology - PAP  5. Arthralgia of hand, unspecified laterality Persistent- OA vs RA. Will check labs today. The patient indicates understanding of these issues and agrees with the plan.  - Rheumatoid Factor - Sedimentation Rate - ANA - Cyclic Citrul Peptide Antibody, IGG

## 2013-03-14 ENCOUNTER — Encounter: Payer: Self-pay | Admitting: *Deleted

## 2013-03-14 LAB — CYCLIC CITRUL PEPTIDE ANTIBODY, IGG: Cyclic Citrullin Peptide Ab: <2 U/mL (ref 0.0–5.0)

## 2013-03-17 ENCOUNTER — Encounter: Payer: Self-pay | Admitting: *Deleted

## 2013-03-17 ENCOUNTER — Encounter: Payer: Self-pay | Admitting: Family Medicine

## 2013-03-17 LAB — HM PAP SMEAR: HM Pap smear: NORMAL

## 2013-03-18 ENCOUNTER — Other Ambulatory Visit: Payer: Self-pay | Admitting: Internal Medicine

## 2013-03-18 MED ORDER — ZOSTER VACCINE LIVE 19400 UNT/0.65ML ~~LOC~~ SOLR: 0.6500 mL | Freq: Once | SUBCUTANEOUS | Status: DC

## 2013-03-18 MED ORDER — LEVOTHYROXINE SODIUM 88 MCG PO TABS: 88.0000 ug | ORAL_TABLET | Freq: Every day | ORAL | Status: DC

## 2013-03-18 NOTE — Telephone Encounter (Signed)
Okay to refill? Refill request was sent to Dr. Margie Ege no longer seeing Dr. Dan Humphreys. Was seen by you on 7/28.

## 2013-03-18 NOTE — Telephone Encounter (Signed)
Rx refilled.

## 2013-03-19 ENCOUNTER — Other Ambulatory Visit (INDEPENDENT_AMBULATORY_CARE_PROVIDER_SITE_OTHER): Payer: Self-pay

## 2013-04-10 ENCOUNTER — Other Ambulatory Visit: Payer: Self-pay | Admitting: Internal Medicine

## 2013-04-10 ENCOUNTER — Other Ambulatory Visit: Payer: Self-pay | Admitting: *Deleted

## 2013-04-10 MED ORDER — LOSARTAN POTASSIUM 100 MG PO TABS: 100.0000 mg | ORAL_TABLET | Freq: Every day | ORAL | Status: DC

## 2013-05-12 ENCOUNTER — Other Ambulatory Visit: Payer: Self-pay | Admitting: Internal Medicine

## 2013-05-12 DIAGNOSIS — F419 Anxiety disorder, unspecified: Secondary | ICD-10-CM

## 2013-05-12 NOTE — Telephone Encounter (Signed)
This is your patient, was sent to Dr. Dan Humphreys

## 2013-05-15 NOTE — Telephone Encounter (Signed)
Rx called to pharmacy

## 2013-06-22 ENCOUNTER — Other Ambulatory Visit: Payer: Self-pay

## 2013-07-17 ENCOUNTER — Other Ambulatory Visit: Payer: Self-pay | Admitting: Internal Medicine

## 2013-07-17 NOTE — Telephone Encounter (Signed)
Dr. Dayton Martes patient, sent to Dr. Dan Humphreys

## 2013-07-21 ENCOUNTER — Other Ambulatory Visit: Payer: Self-pay | Admitting: *Deleted

## 2013-07-25 ENCOUNTER — Other Ambulatory Visit: Payer: Self-pay | Admitting: *Deleted

## 2013-07-25 NOTE — Telephone Encounter (Signed)
Received refill request electronically. Last refill 01/30/13 #30 and 3 refills. Last office visit 03/13/13. Is it okay to refill medication?

## 2013-09-27 ENCOUNTER — Other Ambulatory Visit: Payer: Self-pay | Admitting: Family Medicine

## 2013-09-27 MED ORDER — MELOXICAM 15 MG PO TABS: ORAL_TABLET | ORAL | Status: DC

## 2013-09-27 NOTE — Telephone Encounter (Signed)
Both filled 08/24/13.  Please route response to Know[place], CMA.

## 2013-10-04 ENCOUNTER — Other Ambulatory Visit: Payer: Self-pay

## 2013-10-16 ENCOUNTER — Other Ambulatory Visit: Payer: Self-pay | Admitting: Family Medicine

## 2013-11-10 ENCOUNTER — Other Ambulatory Visit: Payer: Self-pay | Admitting: Family Medicine

## 2013-11-23 ENCOUNTER — Other Ambulatory Visit: Payer: Self-pay | Admitting: Family Medicine

## 2013-11-23 NOTE — Telephone Encounter (Signed)
Pt requesting medication refill. Last ov 02/2013 with no future appts scheduled. pls advise 

## 2013-11-23 NOTE — Telephone Encounter (Signed)
Ok to refill one time only- absolutely needs to be seen for any further refills.

## 2013-12-04 ENCOUNTER — Ambulatory Visit (INDEPENDENT_AMBULATORY_CARE_PROVIDER_SITE_OTHER): Payer: No Typology Code available for payment source | Admitting: Family Medicine

## 2013-12-04 ENCOUNTER — Encounter: Payer: Self-pay | Admitting: Family Medicine

## 2013-12-04 ENCOUNTER — Ambulatory Visit: Payer: BC Managed Care – PPO | Admitting: Family Medicine

## 2013-12-04 VITALS — BP 136/82 | HR 73 | Temp 97.7°F | Ht 62.75 in | Wt 128.8 lb

## 2013-12-04 DIAGNOSIS — E039 Hypothyroidism, unspecified: Secondary | ICD-10-CM

## 2013-12-04 DIAGNOSIS — F419 Anxiety disorder, unspecified: Secondary | ICD-10-CM

## 2013-12-04 DIAGNOSIS — F411 Generalized anxiety disorder: Secondary | ICD-10-CM

## 2013-12-04 LAB — T4, FREE: Free T4: 1 ng/dL (ref 0.60–1.60)

## 2013-12-04 LAB — TSH: TSH: 2.88 u[IU]/mL (ref 0.35–5.50)

## 2013-12-04 NOTE — Assessment & Plan Note (Signed)
Improving. See AVS for instructions for weaning off zoloft.

## 2013-12-04 NOTE — Progress Notes (Signed)
  Subjective:    Patient ID: Sandra [name], female    DOB: 05-31-1951, 63 y.o.   MRN: [id number]  HPI Very pleasant 63 year old female with history of anxiety, hypothyroidism here for med refills.  Has not been seen since 02/2013 (her established care visit).  Anxiety- Feels anxiety has been well controlled with current dose of Zoloft.  Has not needed Xanax in months.     She reports that symptoms of anxiety have been ongoing for years and related to stress with medical issues of her parents and both of her sons, both who had ITP and her oldest who had bacterial meningitis. She actually would like to try to decrease dose of her Zoloft if possible.  Hypothyroidism- On synthroid 88 mcg daily.  Denies any symptoms of hypo or hyperthyroidism. Lab Results  Component Value Date   TSH 3.14 03/13/2013     Outpatient Encounter Prescriptions as of 12/04/2013  Medication Sig  . ALPRAZolam (XANAX) 0.25 MG tablet TAKE 1/2 TO 1 TABLET DAILY IF NEEDED FORANXIETY  . BIOTIN 5000 PO Take one by mouth daily  . fluticasone (FLONASE) 50 MCG/ACT nasal spray USE ONE TO TWO SPRAYS IN EACH NOSTRIL EVERYDAY IF NEEDED  . levothyroxine (SYNTHROID, LEVOTHROID) 88 MCG tablet TAKE 1 TABLET EVERY DAY  . losartan (COZAAR) 100 MG tablet TAKE 1 TABLET EVERY DAY  . magnesium gluconate (MAGONATE) 500 MG tablet Take 500 mg by mouth daily.  . meloxicam (MOBIC) 15 MG tablet TAKE 1 TABLET EVERY DAY  . sertraline (ZOLOFT) 25 MG tablet TAKE 1 TABLET EVERY DAY  . [DISCONTINUED] zoster vaccine live, PF, (ZOSTAVAX) 95621 UNT/0.65ML injection Inject 19,400 Units into the skin once.    Review of Systems  See HPI Denies any symptoms of hypo or hyperthyroidism       Objective:   Physical Exam  BP 136/82  Pulse 73  Temp(Src) 97.7 F (36.5 C) (Oral)  Ht 5' 2.75" (1.594 m)  Wt 128 lb 12 oz (58.401 kg)  BMI 22.98 kg/m2  SpO2 95%  General:  Well-developed,well-nourished,in no acute distress; alert,appropriate and  cooperative throughout examination Head:  normocephalic and atraumatic.   Eyes:  vision grossly intact, pupils equal, pupils round, and pupils reactive to light.   Ears:  R ear normal and L ear normal.   Nose:  no external deformity.   Mouth:  good dentition.   Neck:  No deformities, masses, or tenderness noted. Lungs:  Normal respiratory effort, chest expands symmetrically. Lungs are clear to auscultation, no crackles or wheezes. Heart:  Normal rate and regular rhythm. S1 and S2 normal without gallop, murmur, click, rub or other extra sounds. Msk:  No deformity or scoliosis noted of thoracic or lumbar spine.   Extremities:  No clubbing, cyanosis, edema, or deformity noted with normal full range of motion of all joints.   Neurologic:  alert & oriented X3 and gait normal.   Skin:  Intact without suspicious lesions or rashes Psych:  Cognition and judgment appear intact. Alert and cooperative with normal attention span and concentration. No apparent delusions, illusions, hallucinations      Assessment & Plan:

## 2013-12-04 NOTE — Progress Notes (Signed)
Pre visit review using our clinic review tool, if applicable. No additional management support is needed unless otherwise documented below in the visit note. 

## 2013-12-04 NOTE — Assessment & Plan Note (Signed)
Due for labs today.  Orders Placed This Encounter  Procedures  . TSH  . T4, Free    Continue synthroid.

## 2013-12-04 NOTE — Patient Instructions (Signed)
Great to see you.  If you want to wean off zoloft- 1 tablet every other day for one week.  If you tolerate this, then 1/2 tablet every other day for 1 week, then stop it.  I will call you with your lab results.

## 2013-12-05 ENCOUNTER — Encounter: Payer: Self-pay | Admitting: *Deleted

## 2013-12-14 ENCOUNTER — Other Ambulatory Visit: Payer: Self-pay | Admitting: Family Medicine

## 2013-12-25 ENCOUNTER — Other Ambulatory Visit: Payer: Self-pay | Admitting: Family Medicine

## 2014-03-13 ENCOUNTER — Other Ambulatory Visit: Payer: No Typology Code available for payment source

## 2014-03-14 ENCOUNTER — Other Ambulatory Visit: Payer: Self-pay | Admitting: Family Medicine

## 2014-03-14 DIAGNOSIS — E039 Hypothyroidism, unspecified: Secondary | ICD-10-CM

## 2014-03-14 DIAGNOSIS — Z136 Encounter for screening for cardiovascular disorders: Secondary | ICD-10-CM

## 2014-03-14 DIAGNOSIS — Z0000 Encounter for general adult medical examination without abnormal findings: Secondary | ICD-10-CM

## 2014-03-19 ENCOUNTER — Other Ambulatory Visit (INDEPENDENT_AMBULATORY_CARE_PROVIDER_SITE_OTHER): Payer: No Typology Code available for payment source

## 2014-03-19 DIAGNOSIS — E039 Hypothyroidism, unspecified: Secondary | ICD-10-CM

## 2014-03-19 DIAGNOSIS — Z136 Encounter for screening for cardiovascular disorders: Secondary | ICD-10-CM

## 2014-03-19 DIAGNOSIS — Z0000 Encounter for general adult medical examination without abnormal findings: Secondary | ICD-10-CM

## 2014-03-19 LAB — CBC WITH DIFFERENTIAL/PLATELET
Basophils Absolute: 0 10*3/uL (ref 0.0–0.1)
Basophils Relative: 0.4 % (ref 0.0–3.0)
Eosinophils Absolute: 0.2 10*3/uL (ref 0.0–0.7)
Eosinophils Relative: 4.5 % (ref 0.0–5.0)
HCT: 38.8 % (ref 36.0–46.0)
Hemoglobin: 13.2 g/dL (ref 12.0–15.0)
Lymphocytes Relative: 19.8 % (ref 12.0–46.0)
Lymphs Abs: 1 10*3/uL (ref 0.7–4.0)
MCHC: 33.9 g/dL (ref 30.0–36.0)
MCV: 91.6 fl (ref 78.0–100.0)
MONO ABS: 0.4 10*3/uL (ref 0.1–1.0)
MONOS PCT: 8 % (ref 3.0–12.0)
Neutro Abs: 3.4 10*3/uL (ref 1.4–7.7)
Neutrophils Relative %: 67.3 % (ref 43.0–77.0)
PLATELETS: 196 10*3/uL (ref 150.0–400.0)
RBC: 4.24 Mil/uL (ref 3.87–5.11)
RDW: 12.7 % (ref 11.5–15.5)
WBC: 5.1 10*3/uL (ref 4.0–10.5)

## 2014-03-19 LAB — LIPID PANEL
CHOL/HDL RATIO: 3
CHOLESTEROL: 204 mg/dL — AB (ref 0–200)
HDL: 71.5 mg/dL (ref >39.00)
LDL CALC: 115 mg/dL — AB (ref 0–99)
NonHDL: 132.5
Triglycerides: 90 mg/dL (ref 0.0–149.0)
VLDL: 18 mg/dL (ref 0.0–40.0)

## 2014-03-19 LAB — COMPREHENSIVE METABOLIC PANEL
ALK PHOS: 70 U/L (ref 39–117)
ALT: 14 U/L (ref 0–35)
AST: 20 U/L (ref 0–37)
Albumin: 4.2 g/dL (ref 3.5–5.2)
BUN: 15 mg/dL (ref 6–23)
CO2: 27 mEq/L (ref 19–32)
Calcium: 8.9 mg/dL (ref 8.4–10.5)
Chloride: 103 mEq/L (ref 96–112)
Creatinine, Ser: 0.8 mg/dL (ref 0.4–1.2)
GFR: 78.41 mL/min (ref >60.00)
Glucose, Bld: 99 mg/dL (ref 70–99)
POTASSIUM: 4.3 meq/L (ref 3.5–5.1)
SODIUM: 135 meq/L (ref 135–145)
Total Bilirubin: 0.6 mg/dL (ref 0.2–1.2)
Total Protein: 7.1 g/dL (ref 6.0–8.3)

## 2014-03-19 LAB — TSH: TSH: 3.79 u[IU]/mL (ref 0.35–4.50)

## 2014-03-20 ENCOUNTER — Ambulatory Visit (INDEPENDENT_AMBULATORY_CARE_PROVIDER_SITE_OTHER): Payer: No Typology Code available for payment source | Admitting: Family Medicine

## 2014-03-20 ENCOUNTER — Encounter: Payer: Self-pay | Admitting: Family Medicine

## 2014-03-20 VITALS — BP 134/76 | HR 73 | Temp 98.3°F | Ht 62.5 in | Wt 127.5 lb

## 2014-03-20 DIAGNOSIS — E039 Hypothyroidism, unspecified: Secondary | ICD-10-CM

## 2014-03-20 DIAGNOSIS — F411 Generalized anxiety disorder: Secondary | ICD-10-CM

## 2014-03-20 DIAGNOSIS — Z01419 Encounter for gynecological examination (general) (routine) without abnormal findings: Secondary | ICD-10-CM | POA: Insufficient documentation

## 2014-03-20 DIAGNOSIS — F419 Anxiety disorder, unspecified: Secondary | ICD-10-CM

## 2014-03-20 DIAGNOSIS — Z0000 Encounter for general adult medical examination without abnormal findings: Secondary | ICD-10-CM

## 2014-03-20 MED ORDER — LEVOTHYROXINE SODIUM 88 MCG PO TABS: ORAL_TABLET | ORAL | Status: DC

## 2014-03-20 MED ORDER — MELOXICAM 15 MG PO TABS: ORAL_TABLET | ORAL | Status: DC

## 2014-03-20 MED ORDER — ALPRAZOLAM 0.25 MG PO TABS: ORAL_TABLET | ORAL | Status: DC

## 2014-03-20 MED ORDER — ZOSTER VACCINE LIVE 19400 UNT/0.65ML ~~LOC~~ SOLR: 0.6500 mL | Freq: Once | SUBCUTANEOUS | Status: DC

## 2014-03-20 MED ORDER — LOSARTAN POTASSIUM 100 MG PO TABS: ORAL_TABLET | ORAL | Status: DC

## 2014-03-20 MED ORDER — FLUTICASONE PROPIONATE 50 MCG/ACT NA SUSP: NASAL | Status: DC

## 2014-03-20 NOTE — Patient Instructions (Addendum)
Check with your insurance to see if they will cover the shingles shot. I have sent the prescription for the shingles vaccine to your pharmacy.  Please call to set up your mammogram.   You will be due for a colonoscopy next year.

## 2014-03-20 NOTE — Progress Notes (Addendum)
Subjective:    Patient ID: Sandra [name], female    DOB: 1950/12/08, 63 y.o.   MRN: [id number]  HPI Very pleasant 63 year old female with history of anxiety, hypothyroidism here for CPX.  G2P2- no h/o abnormal pap smears.  Last pap smear done by me on 03/14/13. LMP 10 years ago.  No h/o post menopausal bleeding.  Does have some vaginal dryness.  No insomnia or hot flashes.  Mammogram 02/20/13- she will call to schedule.  Colonoscopy UTD ( Dr. Vira Agar) on 12/13/09 (scanned in [place])- 5 year recall. NO blood in stool or changes in bowel habits.   Anxiety- weaned herself off zoloft 3 months ago and feels great.  Denies any symptoms of anxiety or depression.  Takes very occasional xanax.   She reports that symptoms of anxiety have been ongoing for years started with stressors related to medical issues of with parents and both of her sons, both who had ITP and her oldest who had bacterial meningitis.   Hypothyroidism- On synthroid 88 mcg daily.  Denies any symptoms of hypo or hyperthyroidism. Lab Results  Component Value Date   TSH 3.79 03/19/2014   Lab Results  Component Value Date   CHOL 204* 03/19/2014   HDL 71.50 03/19/2014   LDLCALC 115* 03/19/2014   LDLDIRECT 127.0 03/13/2013   TRIG 90.0 03/19/2014   CHOLHDL 3 03/19/2014   Lab Results  Component Value Date   CREATININE 0.8 03/19/2014   Lab Results  Component Value Date   WBC 5.1 03/19/2014   HGB 13.2 03/19/2014   HCT 38.8 03/19/2014   MCV 91.6 03/19/2014   PLT 196.0 03/19/2014       Outpatient Encounter Prescriptions as of 03/20/2014  Medication Sig  . ALPRAZolam (XANAX) 0.25 MG tablet TAKE 1/2 TO 1 TABLET DAILY IF NEEDED FORANXIETY  . BIOTIN 5000 PO Take one by mouth daily  . fluticasone (FLONASE) 50 MCG/ACT nasal spray USE ONE TO TWO SPRAYS IN EACH NOSTRIL EVERYDAY IF NEEDED  . levothyroxine (SYNTHROID, LEVOTHROID) 88 MCG tablet TAKE 1 TABLET EVERY DAY  . losartan (COZAAR) 100 MG tablet TAKE 1 TABLET EVERY DAY  . magnesium  gluconate (MAGONATE) 500 MG tablet Take 500 mg by mouth daily.  . meloxicam (MOBIC) 15 MG tablet TAKE ONE TABLET DAILY WITH FOOD  . zoster vaccine live, PF, (ZOSTAVAX) 19509 UNT/0.65ML injection Inject 19,400 Units into the skin once.  . [DISCONTINUED] ALPRAZolam (XANAX) 0.25 MG tablet TAKE 1/2 TO 1 TABLET DAILY IF NEEDED FORANXIETY  . [DISCONTINUED] fluticasone (FLONASE) 50 MCG/ACT nasal spray USE ONE TO TWO SPRAYS IN EACH NOSTRIL EVERYDAY IF NEEDED  . [DISCONTINUED] levothyroxine (SYNTHROID, LEVOTHROID) 88 MCG tablet TAKE 1 TABLET EVERY DAY  . [DISCONTINUED] losartan (COZAAR) 100 MG tablet TAKE 1 TABLET EVERY DAY  . [DISCONTINUED] meloxicam (MOBIC) 15 MG tablet TAKE ONE TABLET DAILY WITH FOOD  . [DISCONTINUED] sertraline (ZOLOFT) 25 MG tablet TAKE 1 TABLET EVERY DAY    Review of Systems  Patient reports no  vision/ hearing changes,anorexia, weight change, fever ,adenopathy, persistant / recurrent hoarseness, swallowing issues, chest pain, edema,persistant / recurrent cough, hemoptysis, dyspnea(rest, exertional, paroxysmal nocturnal), gastrointestinal  bleeding (melena, rectal bleeding), abdominal pain, excessive heart burn, GU symptoms(dysuria, hematuria, pyuria, voiding/incontinence  Issues) syncope, focal weakness, severe memory loss, concerning skin lesions, depression, anxiety, abnormal bruising/bleeding, major joint swelling, breast masses or abnormal vaginal bleeding.         Objective:   Physical Exam  BP 134/76  Pulse 73  Temp(Src) 98.3 F (36.8  C) (Oral)  Ht 5' 2.5" (1.588 m)  Wt 127 lb 8 oz (57.834 kg)  BMI 22.93 kg/m2  SpO2 97%   General:  Well-developed,well-nourished,in no acute distress; alert,appropriate and cooperative throughout examination Head:  normocephalic and atraumatic.   Eyes:  vision grossly intact, pupils equal, pupils round, and pupils reactive to light.   Ears:  R ear normal and L ear normal.   Nose:  no external deformity.   Mouth:  good dentition.    Neck:  No deformities, masses, or tenderness noted. Breasts:  No mass, nodules, thickening, tenderness, bulging, retraction, inflamation, nipple discharge or skin changes noted.   Lungs:  Normal respiratory effort, chest expands symmetrically. Lungs are clear to auscultation, no crackles or wheezes. Heart:  Normal rate and regular rhythm. S1 and S2 normal without gallop, murmur, click, rub or other extra sounds. Abdomen:  Bowel sounds positive,abdomen soft and non-tender without masses, organomegaly or hernias noted. Msk:  No deformity or scoliosis noted of thoracic or lumbar spine.   Extremities:  No clubbing, cyanosis, edema, or deformity noted with normal full range of motion of all joints.   Neurologic:  alert & oriented X3 and gait normal.   Skin:  Intact without suspicious lesions or rashes Cervical Nodes:  No lymphadenopathy noted Axillary Nodes:  No palpable lymphadenopathy Psych:  Cognition and judgment appear intact. Alert and cooperative with normal attention span and concentration. No apparent delusions, illusions, hallucinations     Assessment & Plan:

## 2014-03-20 NOTE — Assessment & Plan Note (Signed)
Discussed USPSTF recommendations of cervical cancer screening.  She is aware that interval of 3 years is recommended and agrees with this plan. No h/o abnormal pap smears.

## 2014-03-20 NOTE — Assessment & Plan Note (Signed)
Stable on current rx. No changes. 

## 2014-03-20 NOTE — Assessment & Plan Note (Addendum)
Stable off zoloft. No changes.

## 2014-03-20 NOTE — Progress Notes (Signed)
Pre visit review using our clinic review tool, if applicable. No additional management support is needed unless otherwise documented below in the visit note. 

## 2014-03-20 NOTE — Assessment & Plan Note (Signed)
Reviewed preventive care protocols, scheduled due services, and updated immunizations Discussed nutrition, exercise, diet, and healthy lifestyle.  Zostavax eRx sent to pharmacy. She will call to set up mammogram.

## 2014-07-03 ENCOUNTER — Other Ambulatory Visit: Payer: Self-pay | Admitting: Family Medicine

## 2014-07-19 ENCOUNTER — Encounter: Payer: Self-pay | Admitting: Family Medicine

## 2014-08-23 ENCOUNTER — Other Ambulatory Visit: Payer: Self-pay | Admitting: Family Medicine

## 2014-09-22 ENCOUNTER — Other Ambulatory Visit: Payer: Self-pay | Admitting: Family Medicine

## 2014-09-24 ENCOUNTER — Encounter: Payer: Self-pay | Admitting: Family Medicine

## 2014-09-24 ENCOUNTER — Ambulatory Visit (INDEPENDENT_AMBULATORY_CARE_PROVIDER_SITE_OTHER): Payer: No Typology Code available for payment source | Admitting: Family Medicine

## 2014-09-24 ENCOUNTER — Ambulatory Visit: Payer: No Typology Code available for payment source | Admitting: Family Medicine

## 2014-09-24 VITALS — BP 108/68 | HR 94 | Temp 98.1°F | Wt 123.5 lb

## 2014-09-24 DIAGNOSIS — E039 Hypothyroidism, unspecified: Secondary | ICD-10-CM

## 2014-09-24 DIAGNOSIS — F419 Anxiety disorder, unspecified: Secondary | ICD-10-CM

## 2014-09-24 MED ORDER — SERTRALINE HCL 25 MG PO TABS: 25.0000 mg | ORAL_TABLET | Freq: Every day | ORAL | Status: DC

## 2014-09-24 MED ORDER — FLUTICASONE PROPIONATE 50 MCG/ACT NA SUSP: NASAL | Status: DC

## 2014-09-24 MED ORDER — LOSARTAN POTASSIUM 100 MG PO TABS: 100.0000 mg | ORAL_TABLET | Freq: Every day | ORAL | Status: DC

## 2014-09-24 NOTE — Patient Instructions (Addendum)
Good to see you. Hang in there.  We are starting zoloft 25 mg daily. Please call me in a few weeks with an update.    Ciprofloxacin is the name of the the antibiotic I was talking about.

## 2014-09-24 NOTE — Progress Notes (Signed)
Subjective:   Patient ID: Sandra [name], female    DOB: 1950/12/22, 64 y.o.   MRN: [id number]  Sandra [name] is a pleasant 64 y.o. year old female who presents to clinic today with Follow-up  on 09/24/2014  HPI:  Anxiety- has not been doing so well.  Her son is having worsening health issues and given that he does not have a spleen, he is limited in terms of treatment options. She is constantly anxious and tearful now.  Denies feeling depressed but she does feel she is "too worried." Not sleeping well. Appetite ok but not great- Wt Readings from Last 3 Encounters:  09/24/14 123 lb 8 oz (56.019 kg)  03/20/14 127 lb 8 oz (57.834 kg)  12/04/13 128 lb 12 oz (58.401 kg)  No SI or HI.  Hypothyroidism- has been stable on synthroid 88 mcg daily. Lab Results  Component Value Date   TSH 3.79 03/19/2014   Denies any symptoms of hypo or hyperthyroidism.  Current Outpatient Prescriptions on File Prior to Visit  Medication Sig Dispense Refill  . ALPRAZolam (XANAX) 0.25 MG tablet TAKE 1/2 TO 1 TABLET DAILY IF NEEDED FORANXIETY 30 tablet 0  . BIOTIN 5000 PO Take one by mouth daily    . levothyroxine (SYNTHROID, LEVOTHROID) 88 MCG tablet TAKE 1 TABLET EVERY DAY 30 tablet 5  . magnesium gluconate (MAGONATE) 500 MG tablet Take 500 mg by mouth daily.    . meloxicam (MOBIC) 15 MG tablet TAKE ONE TABLET DAILY WITH FOOD 30 tablet 2  . zoster vaccine live, PF, (ZOSTAVAX) 49449 UNT/0.65ML injection Inject 19,400 Units into the skin once. 1 each 0   No current facility-administered medications on file prior to visit.    No Known Allergies  Past Medical History  Diagnosis Date  . Hypertension   . Thyroid disease     Past Surgical History  Procedure Laterality Date  . Cesarean section      X 2    Family History  Problem Relation Age of Onset  . Arthritis Mother     s/p hip replacement  . Hyperlipidemia Mother   . Heart disease Mother   . Hypertension Mother   . Arthritis  Father   . Hyperlipidemia Father   . Heart disease Father   . Hypertension Father   . Dementia Father   . Thrombocytopenia Son   . Hearing loss Son   . Thrombocytopenia Son     History   Social History  . Marital Status: Married    Spouse Name: N/A    Number of Children: N/A  . Years of Education: N/A   Occupational History  . Not on file.   Social History Main Topics  . Smoking status: Never Smoker   . Smokeless tobacco: Not on file  . Alcohol Use: Yes  . Drug Use: No  . Sexual Activity: Not on file   Other Topics Concern  . Not on file   Social History Narrative   Lives in [place] with husband. Two children 31YO, 36YO.      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    Review of Systems  Constitutional: Negative.   HENT: Negative.   Endocrine: Negative.   Neurological: Negative.   Psychiatric/Behavioral: Positive for sleep disturbance. Negative for  suicidal ideas, hallucinations, behavioral problems, confusion, self-injury, dysphoric mood and agitation. The patient is nervous/anxious.   All other systems reviewed and are negative.      Objective:    BP 108/68 mmHg  Pulse 94  Temp(Src) 98.1 F (36.7 C) (Oral)  Wt 123 lb 8 oz (56.019 kg)  SpO2 97%   Physical Exam  Constitutional: She is oriented to person, place, and time. She appears well-developed and well-nourished. No distress.  HENT:  Head: Normocephalic and atraumatic.  Eyes: Conjunctivae are normal.  Neck: Normal range of motion.  Cardiovascular: Normal rate.   Pulmonary/Chest: Effort normal and breath sounds normal. No respiratory distress.  Neurological: She is alert and oriented to person, place, and time. No cranial nerve deficit.  Skin: Skin is warm and dry.  Psychiatric:  Tearful but appropriate. Good  eye contact  Nursing note and vitals reviewed.         Assessment & Plan:   Hypothyroidism, unspecified hypothyroidism type  Anxiety No Follow-up on file.

## 2014-09-24 NOTE — Assessment & Plan Note (Signed)
>  25 minutes spent in face to face time with patient, >50% spent in counselling or coordination of care Deteriorated- situational. Offered my support.  Feels she does have people she can talk to. Felt zoloft worked well for her in past. Restart zoloft 25 mg daily.  Has not taken xanax in several weeks but ok to take prn as well. The patient indicates understanding of these issues and agrees with the plan.

## 2014-09-24 NOTE — Progress Notes (Signed)
Pre visit review using our clinic review tool, if applicable. No additional management support is needed unless otherwise documented below in the visit note. 

## 2014-10-10 ENCOUNTER — Other Ambulatory Visit: Payer: Self-pay | Admitting: Family Medicine

## 2014-10-10 NOTE — Telephone Encounter (Signed)
Rx called in to requested pharmacy 

## 2014-10-10 NOTE — Telephone Encounter (Signed)
Last f/u ov 09/2014. Last Rx 03/2014.

## 2014-11-24 ENCOUNTER — Other Ambulatory Visit: Payer: Self-pay | Admitting: Family Medicine

## 2014-11-26 NOTE — Telephone Encounter (Signed)
Last OV 09/24/2014, last filled 08/23/14

## 2015-01-05 ENCOUNTER — Other Ambulatory Visit: Payer: Self-pay | Admitting: Family Medicine

## 2015-01-23 ENCOUNTER — Other Ambulatory Visit: Payer: Self-pay | Admitting: Family Medicine

## 2015-01-23 NOTE — Telephone Encounter (Signed)
Last f/u appt 09/2014 

## 2015-01-24 NOTE — Telephone Encounter (Signed)
Rx called in to requested pharmacy 

## 2015-02-12 ENCOUNTER — Ambulatory Visit (INDEPENDENT_AMBULATORY_CARE_PROVIDER_SITE_OTHER): Payer: No Typology Code available for payment source | Admitting: Family Medicine

## 2015-02-12 ENCOUNTER — Encounter: Payer: Self-pay | Admitting: Family Medicine

## 2015-02-12 VITALS — BP 116/78 | HR 75 | Temp 97.6°F | Wt 127.5 lb

## 2015-02-12 DIAGNOSIS — M79621 Pain in right upper arm: Secondary | ICD-10-CM | POA: Diagnosis not present

## 2015-02-12 DIAGNOSIS — M25529 Pain in unspecified elbow: Secondary | ICD-10-CM | POA: Insufficient documentation

## 2015-02-12 DIAGNOSIS — M1990 Unspecified osteoarthritis, unspecified site: Secondary | ICD-10-CM | POA: Diagnosis not present

## 2015-02-12 DIAGNOSIS — G4700 Insomnia, unspecified: Secondary | ICD-10-CM | POA: Diagnosis not present

## 2015-02-12 DIAGNOSIS — M25521 Pain in right elbow: Secondary | ICD-10-CM

## 2015-02-12 MED ORDER — MELOXICAM 15 MG PO TABS: 15.0000 mg | ORAL_TABLET | Freq: Two times a day (BID) | ORAL | Status: DC | PRN

## 2015-02-12 NOTE — Progress Notes (Signed)
Pre visit review using our clinic review tool, if applicable. No additional management support is needed unless otherwise documented below in the visit note. 

## 2015-02-12 NOTE — Progress Notes (Signed)
Subjective:   Patient ID: Sandra [name], female    DOB: 1950-09-25, 64 y.o.   MRN: [id number]  Sandra [name] is a pleasant 64 y.o. year old female with h/o OA (multiple joints) and carpal tunnel (bilateral) who presents to clinic today with joint pain and Arm Pain  on 02/12/2015  HPI:  Has been more active lately.  Now at night, her arms and hands hurt more despite wearing her wrist splints at night. This is causing problems sleeping.  Taking a xanax at times to sleep.  Takes mobic 15 mg daily in the morning but this wears off by night.  No UE weakness, redness or warmth in her joints.  No known injury.  Current Outpatient Prescriptions on File Prior to Visit  Medication Sig Dispense Refill  . ALPRAZolam (XANAX) 0.25 MG tablet TAKE 1/2 TO 1 TABLET DAILY IF NEEDED FORANXIETY 30 tablet 0  . BIOTIN 5000 PO Take one by mouth daily    . fluticasone (FLONASE) 50 MCG/ACT nasal spray USE ONE TO TWO SPRAYS IN EACH NOSTRIL EVERYDAY IF NEEDED 16 g 3  . levothyroxine (SYNTHROID, LEVOTHROID) 88 MCG tablet TAKE 1 TABLET EVERY DAY 30 tablet 5  . losartan (COZAAR) 100 MG tablet Take 1 tablet (100 mg total) by mouth daily. 30 tablet 11  . magnesium gluconate (MAGONATE) 500 MG tablet Take 500 mg by mouth daily.    Marland Kitchen zoster vaccine live, PF, (ZOSTAVAX) 08657 UNT/0.65ML injection Inject 19,400 Units into the skin once. 1 each 0   No current facility-administered medications on file prior to visit.    No Known Allergies  Past Medical History  Diagnosis Date  . Hypertension   . Thyroid disease     Past Surgical History  Procedure Laterality Date  . Cesarean section      X 2    Family History  Problem Relation Age of Onset  . Arthritis Mother     s/p hip replacement  . Hyperlipidemia Mother   . Heart disease Mother   . Hypertension Mother   . Arthritis Father   . Hyperlipidemia Father   . Heart disease Father   . Hypertension Father   . Dementia Father   . Thrombocytopenia  Son   . Hearing loss Son   . Thrombocytopenia Son     History   Social History  . Marital Status: Married    Spouse Name: N/A  . Number of Children: N/A  . Years of Education: N/A   Occupational History  . Not on file.   Social History Main Topics  . Smoking status: Never Smoker   . Smokeless tobacco: Not on file  . Alcohol Use: Yes  . Drug Use: No  . Sexual Activity: Not on file   Other Topics Concern  . Not on file   Social History Narrative   Lives in [place] with husband. Two children 31YO, 36YO.      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   Review of Systems  Constitutional: Negative.   Respiratory: Negative.   Cardiovascular: Negative.   Gastrointestinal: Negative.   Musculoskeletal: Positive for myalgias and joint swelling. Negative for neck pain and neck stiffness.  Skin: Negative.   Psychiatric/Behavioral: Positive for sleep disturbance. Negative for dysphoric mood. The patient is  not nervous/anxious.   All other systems reviewed and are negative.      Objective:    BP 116/78 mmHg  Pulse 75  Temp(Src) 97.6 F (36.4 C) (Oral)  Wt 127 lb 8 oz (57.834 kg)  SpO2 97%   Physical Exam  Constitutional: She is oriented to person, place, and time. She appears well-developed and well-nourished. No distress.  HENT:  Head: Normocephalic and atraumatic.  Eyes: Conjunctivae are normal.  Cardiovascular: Normal rate.   Pulmonary/Chest: Effort normal.  Musculoskeletal:  Swollen dips pips bilateral, pos phallen  Shoulder- neg empty can  Normal grip strength bilaterally  Neurological: She is alert and oriented to person, place, and time. No cranial nerve deficit.  Skin: Skin is warm and dry.  Psychiatric: She has a normal mood and affect. Her behavior is normal. Judgment  and thought content normal.  Nursing note and vitals reviewed.         Assessment & Plan:   Pain in joint, upper arm, right  Arthritis No Follow-up on file.

## 2015-02-12 NOTE — Assessment & Plan Note (Signed)
Deteriorated. >25 minutes spent in face to face time with patient, >50% spent in counselling or coordination of care Advised scheduling Tylenol ES in the morning, with Tylenol PM at night to help with sleep. Increase Mobic- with food- to 15 mg once to twice daily as needed. Call or return to clinic prn if these symptoms worsen or fail to improve as anticipated. The patient indicates understanding of these issues and agrees with the plan.

## 2015-02-12 NOTE — Patient Instructions (Signed)
It was great to see you.  Start taking Tylenol ES in the morning and a tylenol pm at night.  Mobic 15 mg once to twice daily as needed with food.

## 2015-03-14 ENCOUNTER — Other Ambulatory Visit: Payer: Self-pay | Admitting: Family Medicine

## 2015-03-14 DIAGNOSIS — E039 Hypothyroidism, unspecified: Secondary | ICD-10-CM

## 2015-03-14 DIAGNOSIS — Z0000 Encounter for general adult medical examination without abnormal findings: Secondary | ICD-10-CM

## 2015-03-21 ENCOUNTER — Other Ambulatory Visit (INDEPENDENT_AMBULATORY_CARE_PROVIDER_SITE_OTHER): Payer: Managed Care, Other (non HMO)

## 2015-03-21 DIAGNOSIS — Z0000 Encounter for general adult medical examination without abnormal findings: Secondary | ICD-10-CM

## 2015-03-21 DIAGNOSIS — E039 Hypothyroidism, unspecified: Secondary | ICD-10-CM

## 2015-03-21 LAB — COMPREHENSIVE METABOLIC PANEL
ALK PHOS: 73 U/L (ref 39–117)
ALT: 17 U/L (ref 0–35)
AST: 19 U/L (ref 0–37)
Albumin: 4.4 g/dL (ref 3.5–5.2)
BUN: 12 mg/dL (ref 6–23)
CALCIUM: 9.4 mg/dL (ref 8.4–10.5)
CO2: 29 meq/L (ref 19–32)
Chloride: 102 mEq/L (ref 96–112)
Creatinine, Ser: 0.68 mg/dL (ref 0.40–1.20)
GFR: 92.92 mL/min (ref >60.00)
Glucose, Bld: 96 mg/dL (ref 70–99)
POTASSIUM: 4.1 meq/L (ref 3.5–5.1)
Sodium: 137 mEq/L (ref 135–145)
Total Bilirubin: 0.6 mg/dL (ref 0.2–1.2)
Total Protein: 7.2 g/dL (ref 6.0–8.3)

## 2015-03-21 LAB — LIPID PANEL
CHOLESTEROL: 204 mg/dL — AB (ref 0–200)
HDL: 62.5 mg/dL (ref >39.00)
LDL CALC: 128 mg/dL — AB (ref 0–99)
NONHDL: 141.12
Total CHOL/HDL Ratio: 3
Triglycerides: 64 mg/dL (ref 0.0–149.0)
VLDL: 12.8 mg/dL (ref 0.0–40.0)

## 2015-03-21 LAB — CBC WITH DIFFERENTIAL/PLATELET
BASOS PCT: 0.8 % (ref 0.0–3.0)
Basophils Absolute: 0 10*3/uL (ref 0.0–0.1)
Eosinophils Absolute: 0.1 10*3/uL (ref 0.0–0.7)
Eosinophils Relative: 2.5 % (ref 0.0–5.0)
HCT: 39.4 % (ref 36.0–46.0)
HEMOGLOBIN: 13.6 g/dL (ref 12.0–15.0)
LYMPHS ABS: 1 10*3/uL (ref 0.7–4.0)
LYMPHS PCT: 19.4 % (ref 12.0–46.0)
MCHC: 34.5 g/dL (ref 30.0–36.0)
MCV: 89.1 fl (ref 78.0–100.0)
MONOS PCT: 7.4 % (ref 3.0–12.0)
Monocytes Absolute: 0.4 10*3/uL (ref 0.1–1.0)
NEUTROS ABS: 3.6 10*3/uL (ref 1.4–7.7)
NEUTROS PCT: 69.9 % (ref 43.0–77.0)
Platelets: 220 10*3/uL (ref 150.0–400.0)
RBC: 4.42 Mil/uL (ref 3.87–5.11)
RDW: 12.4 % (ref 11.5–15.5)
WBC: 5.1 10*3/uL (ref 4.0–10.5)

## 2015-03-21 LAB — T4, FREE: Free T4: 0.97 ng/dL (ref 0.60–1.60)

## 2015-03-21 LAB — TSH: TSH: 3.24 u[IU]/mL (ref 0.35–4.50)

## 2015-03-25 ENCOUNTER — Encounter: Payer: No Typology Code available for payment source | Admitting: Family Medicine

## 2015-03-27 ENCOUNTER — Other Ambulatory Visit: Payer: Self-pay | Admitting: Family Medicine

## 2015-04-09 ENCOUNTER — Encounter: Payer: Self-pay | Admitting: Family Medicine

## 2015-04-09 ENCOUNTER — Ambulatory Visit (INDEPENDENT_AMBULATORY_CARE_PROVIDER_SITE_OTHER): Payer: Managed Care, Other (non HMO) | Admitting: Family Medicine

## 2015-04-09 ENCOUNTER — Other Ambulatory Visit (HOSPITAL_COMMUNITY)
Admission: RE | Admit: 2015-04-09 | Discharge: 2015-04-09 | Disposition: A | Discharge status: Home / Self Care | Payer: Managed Care, Other (non HMO) | Source: Ambulatory Visit | Attending: Family Medicine | Admitting: Family Medicine

## 2015-04-09 VITALS — BP 124/72 | HR 81 | Temp 98.1°F | Ht 62.5 in | Wt 124.2 lb

## 2015-04-09 DIAGNOSIS — Z1211 Encounter for screening for malignant neoplasm of colon: Secondary | ICD-10-CM | POA: Diagnosis not present

## 2015-04-09 DIAGNOSIS — Z01419 Encounter for gynecological examination (general) (routine) without abnormal findings: Secondary | ICD-10-CM | POA: Insufficient documentation

## 2015-04-09 DIAGNOSIS — Z1151 Encounter for screening for human papillomavirus (HPV): Secondary | ICD-10-CM | POA: Diagnosis present

## 2015-04-09 DIAGNOSIS — G4700 Insomnia, unspecified: Secondary | ICD-10-CM | POA: Diagnosis not present

## 2015-04-09 DIAGNOSIS — E039 Hypothyroidism, unspecified: Secondary | ICD-10-CM

## 2015-04-09 DIAGNOSIS — I10 Essential (primary) hypertension: Secondary | ICD-10-CM

## 2015-04-09 DIAGNOSIS — Z0000 Encounter for general adult medical examination without abnormal findings: Secondary | ICD-10-CM | POA: Diagnosis not present

## 2015-04-09 DIAGNOSIS — F419 Anxiety disorder, unspecified: Secondary | ICD-10-CM | POA: Diagnosis not present

## 2015-04-09 MED ORDER — TRAZODONE HCL 50 MG PO TABS: 25.0000 mg | ORAL_TABLET | Freq: Every evening | ORAL | Status: DC | PRN

## 2015-04-09 NOTE — Assessment & Plan Note (Signed)
Discussed USPSTF recommendations of cervical cancer screening.  She is aware that interval of 3 years is recommended but pt would prefer to have pap smear done today.  

## 2015-04-09 NOTE — Addendum Note (Signed)
Addended by: Modena Nunnery on: 04/09/2015 08:59 AM   Modules accepted: Orders

## 2015-04-09 NOTE — Assessment & Plan Note (Signed)
New- Total time spent with patient was 65 minutes, with 25 minutes spent specifically on problem visit concerning Insomnia. Greater than 50 percent of the 25 minutes was spent in counseling the Insomnia. Discussed sleep hygiene, eRx sent for Trazodone 25-50 mg nightly qhs insomnia. Call or return to clinic prn if these symptoms worsen or fail to improve as anticipated. The patient indicates understanding of these issues and agrees with the plan.

## 2015-04-09 NOTE — Progress Notes (Signed)
Subjective:    Patient ID: Sandra [name], female    DOB: 1950/12/20, 64 y.o.   MRN: [id number]  HPI Very pleasant 64 year old female with history of anxiety, hypothyroidism here for CPX and follow up of chronic medical conditions.  G2P2- no h/o abnormal pap smears.  Last pap smear done by me on 03/14/13. LMP 10 years ago. Has not had shingles vaccine- did give her to rx last year.  No h/o post menopausal bleeding.  Does have some vaginal dryness.  No insomnia or hot flashes.  Mammogram 07/17/14.  Colonoscopy UTD ( Dr. Vira Agar) on 12/13/09 (scanned in [place])- 5 year recall. NO blood in stool or changes in bowel habits. Called her insurance and they will not cover a 5 year recall since colonoscopy was normal.  HTN- well controlled on Cozaar 100 mg daily. Denies any HA, blurred vision, CP or SOB.  Anxiety- saw her for worsening situational anxiety in 09/2014.  At that time, her son's health was worsening and she was becoming understandably more tearful and anxious.  Restarted zoloft 25 mg daily at that time but she ended up stopping it again.  She having issues falling and staying asleep.  Does not feel anxious about anything other than not sleeping well. Tries not to take xanax for this.  Takes tylenol pm or benadryl which does help at times.  Hypothyroidism- On synthroid 88 mcg daily.  Denies any symptoms of hypo or hyperthyroidism. Lab Results  Component Value Date   TSH 3.24 03/21/2015   Lab Results  Component Value Date   CHOL 204* 03/21/2015   HDL 62.50 03/21/2015   LDLCALC 128* 03/21/2015   LDLDIRECT 127.0 03/13/2013   TRIG 64.0 03/21/2015   CHOLHDL 3 03/21/2015   Lab Results  Component Value Date   CREATININE 0.68 03/21/2015   Lab Results  Component Value Date   WBC 5.1 03/21/2015   HGB 13.6 03/21/2015   HCT 39.4 03/21/2015   MCV 89.1 03/21/2015   PLT 220.0 03/21/2015       Outpatient Encounter Prescriptions as of 04/09/2015  Medication Sig  . ALPRAZolam  (XANAX) 0.25 MG tablet TAKE 1/2 TO 1 TABLET DAILY IF NEEDED FORANXIETY  . BIOTIN 5000 PO Take one by mouth daily  . fluticasone (FLONASE) 50 MCG/ACT nasal spray USE ONE TO TWO SPRAYS IN EACH NOSTRIL EVERYDAY IF NEEDED  . levothyroxine (SYNTHROID, LEVOTHROID) 88 MCG tablet TAKE 1 TABLET EVERY DAY  . losartan (COZAAR) 100 MG tablet Take 1 tablet (100 mg total) by mouth daily.  . magnesium gluconate (MAGONATE) 500 MG tablet Take 500 mg by mouth daily.  . meloxicam (MOBIC) 15 MG tablet Take 1 tablet (15 mg total) by mouth 2 (two) times daily as needed for pain.  . [DISCONTINUED] zoster vaccine live, PF, (ZOSTAVAX) 75170 UNT/0.65ML injection Inject 19,400 Units into the skin once.   No facility-administered encounter medications on file as of 04/09/2015.    Review of Systems  Constitutional: Negative.   HENT: Negative.   Eyes: Negative.   Respiratory: Negative.   Cardiovascular: Negative.   Gastrointestinal: Negative.   Endocrine: Negative.   Genitourinary: Negative.   Musculoskeletal: Negative.   Skin: Negative.   Allergic/Immunologic: Negative.   Neurological: Negative.   Hematological: Negative.   Psychiatric/Behavioral: Positive for sleep disturbance. Negative for suicidal ideas, hallucinations, behavioral problems, confusion, self-injury, dysphoric mood and decreased concentration. The patient is not nervous/anxious and is not hyperactive.   All other systems reviewed and are negative.  Objective:   Physical Exam  BP 124/72 mmHg  Pulse 81  Temp(Src) 98.1 F (36.7 C) (Oral)  Ht 5' 2.5" (1.588 m)  Wt 124 lb 4 oz (56.359 kg)  BMI 22.35 kg/m2  SpO2 99%   General:  Well-developed,well-nourished,in no acute distress; alert,appropriate and cooperative throughout examination Head:  normocephalic and atraumatic.   Eyes:  vision grossly intact, pupils equal, pupils round, and pupils reactive to light.   Ears:  R ear normal and L ear normal.   Nose:  no external deformity.    Mouth:  good dentition.   Neck:  No deformities, masses, or tenderness noted. Breasts:  No mass, nodules, thickening, tenderness, bulging, retraction, inflamation, nipple discharge or skin changes noted.   Lungs:  Normal respiratory effort, chest expands symmetrically. Lungs are clear to auscultation, no crackles or wheezes. Heart:  Normal rate and regular rhythm. S1 and S2 normal without gallop, murmur, click, rub or other extra sounds. Abdomen:  Bowel sounds positive,abdomen soft and non-tender without masses, organomegaly or hernias noted. Rectal:  no external abnormalities.   Genitalia:  Pelvic Exam:        External: normal female genitalia without lesions or masses        Vagina: normal without lesions or masses        Cervix: normal without lesions or masses        Adnexa: normal bimanual exam without masses or fullness        Uterus: normal by palpation        Pap smear: performed Msk:  No deformity or scoliosis noted of thoracic or lumbar spine.   Extremities:  No clubbing, cyanosis, edema, or deformity noted with normal full range of motion of all joints.   Neurologic:  alert & oriented X3 and gait normal.   Skin:  Intact without suspicious lesions or rashes Cervical Nodes:  No lymphadenopathy noted Axillary Nodes:  No palpable lymphadenopathy Psych:  Cognition and judgment appear intact. Alert and cooperative with normal attention span and concentration. No apparent delusions, illusions, hallucinations      Assessment & Plan:

## 2015-04-09 NOTE — Assessment & Plan Note (Addendum)
Reviewed preventive care protocols, scheduled due services, and updated immunizations Discussed nutrition, exercise, diet, and healthy lifestyle.  She will call insurance company again regarding zostavax coverage.  IFOB

## 2015-04-09 NOTE — Assessment & Plan Note (Signed)
Well controlled on current rx. No changes made. 

## 2015-04-09 NOTE — Patient Instructions (Signed)
Great to see you. Please keep me updated. 

## 2015-04-09 NOTE — Progress Notes (Signed)
Pre visit review using our clinic review tool, if applicable. No additional management support is needed unless otherwise documented below in the visit note. 

## 2015-04-10 LAB — CYTOLOGY - PAP

## 2015-04-11 ENCOUNTER — Encounter: Payer: Self-pay | Admitting: *Deleted

## 2015-08-15 ENCOUNTER — Other Ambulatory Visit: Payer: Self-pay | Admitting: *Deleted

## 2015-08-15 MED ORDER — TRAZODONE HCL 50 MG PO TABS: 25.0000 mg | ORAL_TABLET | Freq: Every evening | ORAL | Status: DC | PRN

## 2015-10-03 ENCOUNTER — Other Ambulatory Visit: Payer: Self-pay | Admitting: *Deleted

## 2015-10-03 MED ORDER — LOSARTAN POTASSIUM 100 MG PO TABS: 100.0000 mg | ORAL_TABLET | Freq: Every day | ORAL | Status: DC

## 2015-10-21 ENCOUNTER — Other Ambulatory Visit: Payer: Self-pay | Admitting: Family Medicine

## 2015-10-22 ENCOUNTER — Other Ambulatory Visit: Payer: Self-pay

## 2015-10-22 MED ORDER — ALPRAZOLAM 0.25 MG PO TABS: ORAL_TABLET | ORAL | Status: DC

## 2015-10-22 NOTE — Telephone Encounter (Signed)
Spoke to pt and advised. States she is not wanting to schedule a follow up at this time. Rx called in to requested pharmacy

## 2015-10-22 NOTE — Telephone Encounter (Signed)
Pt left v/m requesting refill xanax to [facility]. Last refilled #30 on 01/23/15; last annual exam on 04/09/15. Pt takes only when needed when cannot sleep well at night. Pt request cb.

## 2015-10-22 NOTE — Telephone Encounter (Signed)
This pt has not been prescribed this medication since 01/2015. Yesterday, you advised me that she would need an appt to restart medication. See previous request. pls advise

## 2015-10-22 NOTE — Telephone Encounter (Signed)
She uses rarely.  Ok to refill but make sure she has an appointment for follow up soon please.  If she does not keep appointment, we cannot provide further refills.

## 2016-01-22 ENCOUNTER — Other Ambulatory Visit: Payer: Self-pay | Admitting: Family Medicine

## 2016-01-22 NOTE — Telephone Encounter (Signed)
Pt has not been on med, per chart, since 03/2015. Will need OV to restart

## 2016-02-14 ENCOUNTER — Encounter: Payer: Self-pay | Admitting: Family Medicine

## 2016-02-27 ENCOUNTER — Encounter: Payer: Self-pay | Admitting: Family Medicine

## 2016-03-16 ENCOUNTER — Other Ambulatory Visit: Payer: No Typology Code available for payment source

## 2016-03-18 ENCOUNTER — Other Ambulatory Visit: Payer: Self-pay

## 2016-03-18 MED ORDER — MELOXICAM 15 MG PO TABS: 15.0000 mg | ORAL_TABLET | Freq: Two times a day (BID) | ORAL | 0 refills | Status: DC | PRN

## 2016-03-18 NOTE — Telephone Encounter (Signed)
Pt left v/m requesting refill mobic to [facility]. Last refilled # 60 x 2 on 02/12/15. Last cpx 04/09/15 and pt is scheduled for CPX on 04/16/16. Pt request cb.

## 2016-04-03 ENCOUNTER — Other Ambulatory Visit: Payer: Self-pay | Admitting: Family Medicine

## 2016-04-09 ENCOUNTER — Other Ambulatory Visit: Payer: Self-pay | Admitting: Family Medicine

## 2016-04-09 DIAGNOSIS — E038 Other specified hypothyroidism: Secondary | ICD-10-CM

## 2016-04-09 DIAGNOSIS — G4700 Insomnia, unspecified: Secondary | ICD-10-CM

## 2016-04-09 DIAGNOSIS — I10 Essential (primary) hypertension: Secondary | ICD-10-CM

## 2016-04-09 DIAGNOSIS — Z01419 Encounter for gynecological examination (general) (routine) without abnormal findings: Secondary | ICD-10-CM

## 2016-04-14 ENCOUNTER — Other Ambulatory Visit (INDEPENDENT_AMBULATORY_CARE_PROVIDER_SITE_OTHER): Payer: 59

## 2016-04-14 DIAGNOSIS — E038 Other specified hypothyroidism: Secondary | ICD-10-CM

## 2016-04-14 DIAGNOSIS — Z0000 Encounter for general adult medical examination without abnormal findings: Secondary | ICD-10-CM | POA: Diagnosis not present

## 2016-04-14 DIAGNOSIS — Z01419 Encounter for gynecological examination (general) (routine) without abnormal findings: Secondary | ICD-10-CM

## 2016-04-14 LAB — COMPREHENSIVE METABOLIC PANEL
ALT: 13 U/L (ref 0–35)
AST: 19 U/L (ref 0–37)
Albumin: 4.4 g/dL (ref 3.5–5.2)
Alkaline Phosphatase: 51 U/L (ref 39–117)
BILIRUBIN TOTAL: 0.6 mg/dL (ref 0.2–1.2)
BUN: 14 mg/dL (ref 6–23)
CO2: 31 meq/L (ref 19–32)
Calcium: 9.4 mg/dL (ref 8.4–10.5)
Chloride: 102 mEq/L (ref 96–112)
Creatinine, Ser: 0.73 mg/dL (ref 0.40–1.20)
GFR: 85.32 mL/min (ref >60.00)
Glucose, Bld: 95 mg/dL (ref 70–99)
Potassium: 4.3 mEq/L (ref 3.5–5.1)
Sodium: 137 mEq/L (ref 135–145)
Total Protein: 6.9 g/dL (ref 6.0–8.3)

## 2016-04-14 LAB — LIPID PANEL
CHOL/HDL RATIO: 3
Cholesterol: 199 mg/dL (ref 0–200)
HDL: 75.2 mg/dL (ref >39.00)
LDL CALC: 111 mg/dL — AB (ref 0–99)
NonHDL: 123.98
TRIGLYCERIDES: 67 mg/dL (ref 0.0–149.0)
VLDL: 13.4 mg/dL (ref 0.0–40.0)

## 2016-04-14 LAB — CBC WITH DIFFERENTIAL/PLATELET
BASOS ABS: 0 10*3/uL (ref 0.0–0.1)
Basophils Relative: 0.8 % (ref 0.0–3.0)
EOS ABS: 0.2 10*3/uL (ref 0.0–0.7)
Eosinophils Relative: 4.9 % (ref 0.0–5.0)
HEMATOCRIT: 38.5 % (ref 36.0–46.0)
Hemoglobin: 13.2 g/dL (ref 12.0–15.0)
LYMPHS ABS: 0.9 10*3/uL (ref 0.7–4.0)
LYMPHS PCT: 25.1 % (ref 12.0–46.0)
MCHC: 34.3 g/dL (ref 30.0–36.0)
MCV: 90.8 fl (ref 78.0–100.0)
Monocytes Absolute: 0.3 10*3/uL (ref 0.1–1.0)
Monocytes Relative: 8.7 % (ref 3.0–12.0)
NEUTROS ABS: 2.2 10*3/uL (ref 1.4–7.7)
NEUTROS PCT: 60.5 % (ref 43.0–77.0)
PLATELETS: 169 10*3/uL (ref 150.0–400.0)
RBC: 4.25 Mil/uL (ref 3.87–5.11)
RDW: 12.9 % (ref 11.5–15.5)
WBC: 3.6 10*3/uL — ABNORMAL LOW (ref 4.0–10.5)

## 2016-04-14 LAB — T4, FREE: Free T4: 0.93 ng/dL (ref 0.60–1.60)

## 2016-04-14 LAB — TSH: TSH: 7.79 u[IU]/mL — ABNORMAL HIGH (ref 0.35–4.50)

## 2016-04-16 ENCOUNTER — Ambulatory Visit (INDEPENDENT_AMBULATORY_CARE_PROVIDER_SITE_OTHER): Payer: 59 | Admitting: Family Medicine

## 2016-04-16 ENCOUNTER — Encounter: Payer: Self-pay | Admitting: Family Medicine

## 2016-04-16 VITALS — BP 128/78 | HR 67 | Temp 98.1°F | Ht 62.5 in | Wt 118.5 lb

## 2016-04-16 DIAGNOSIS — F419 Anxiety disorder, unspecified: Secondary | ICD-10-CM

## 2016-04-16 DIAGNOSIS — E039 Hypothyroidism, unspecified: Secondary | ICD-10-CM

## 2016-04-16 DIAGNOSIS — Z01419 Encounter for gynecological examination (general) (routine) without abnormal findings: Secondary | ICD-10-CM

## 2016-04-16 DIAGNOSIS — I10 Essential (primary) hypertension: Secondary | ICD-10-CM | POA: Diagnosis not present

## 2016-04-16 DIAGNOSIS — Z23 Encounter for immunization: Secondary | ICD-10-CM | POA: Diagnosis not present

## 2016-04-16 DIAGNOSIS — G4700 Insomnia, unspecified: Secondary | ICD-10-CM

## 2016-04-16 DIAGNOSIS — Z0000 Encounter for general adult medical examination without abnormal findings: Secondary | ICD-10-CM

## 2016-04-16 MED ORDER — MELOXICAM 15 MG PO TABS: 15.0000 mg | ORAL_TABLET | Freq: Two times a day (BID) | ORAL | 3 refills | Status: DC | PRN

## 2016-04-16 MED ORDER — TRAZODONE HCL 100 MG PO TABS: ORAL_TABLET | ORAL | 3 refills | Status: DC

## 2016-04-16 MED ORDER — ALPRAZOLAM 0.25 MG PO TABS: ORAL_TABLET | ORAL | 0 refills | Status: DC

## 2016-04-16 MED ORDER — TETANUS-DIPHTH-ACELL PERTUSSIS 5-2.5-18.5 LF-MCG/0.5 IM SUSP
0.5000 mL | Freq: Once | INTRAMUSCULAR | Status: AC
  Administered 2016-04-16: 0.5 mL via INTRAMUSCULAR

## 2016-04-16 MED ORDER — LEVOTHYROXINE SODIUM 100 MCG PO TABS: 100.0000 ug | ORAL_TABLET | Freq: Every day | ORAL | 3 refills | Status: DC

## 2016-04-16 NOTE — Addendum Note (Signed)
Addended by: Lucille Passy on: 04/16/2016 01:32 PM   Modules accepted: Orders

## 2016-04-16 NOTE — Assessment & Plan Note (Signed)
Well controlled on current rx.

## 2016-04-16 NOTE — Addendum Note (Signed)
Addended by: Modena Nunnery on: 04/16/2016 03:07 PM   Modules accepted: Orders

## 2016-04-16 NOTE — Progress Notes (Signed)
Pre visit review using our clinic review tool, if applicable. No additional management support is needed unless otherwise documented below in the visit note. 

## 2016-04-16 NOTE — Assessment & Plan Note (Signed)
Reviewed preventive care protocols, scheduled due services, and updated immunizations Discussed nutrition, exercise, diet, and healthy lifestyle.  

## 2016-04-16 NOTE — Assessment & Plan Note (Signed)
Not well controlled. Increase trazodone to 50-100 mg nightly as needed.

## 2016-04-16 NOTE — Patient Instructions (Addendum)
Great to see you. We are increasing your synthroid 100 mcg daily.  Repeat labs in 1 month.  Check with your insurance to see if they will cover the shingles shot.  We are increasing your trazodone to 50- 100 mg nightly as needed.

## 2016-04-16 NOTE — Assessment & Plan Note (Signed)
Under replaced. Increase synthroid 100 mcg daily. Repeat labs in 1 month.

## 2016-04-16 NOTE — Progress Notes (Signed)
Subjective:    Patient ID: Sandra [name], female    DOB: 1950-11-29, 65 y.o.   MRN: [id number]  HPI Very pleasant 65 year old female with history of anxiety, hypothyroidism here for CPX and follow up of chronic medical conditions.  G2P2- no h/o abnormal pap smears.  Last pap smear done by me on 04/09/15. LMP 10 years ago. No h/o post menopausal bleeding.  Does have some vaginal dryness.  No insomnia or hot flashes.  Mammogram 02/17/16  Colonoscopy UTD ( Dr. Vira Agar) on 12/13/09 (scanned in [place]). NO blood in stool or changes in bowel habits.  HTN- well controlled on Cozaar 100 mg daily. Denies any HA, blurred vision, CP or SOB.  Anxiety- saw her for worsening situational anxiety in 09/2014.  At that time, her son's health was worsening and she was becoming understandably more tearful and anxious.  Restarted zoloft 25 mg daily at that time but she ended up stopping it again.  She having issues falling and staying asleep.  Does not feel anxious about anything other than not sleeping well. Tries not to take xanax for this.  Takes tylenol pm or benadryl which does help at times.  Hypothyroidism- On synthroid 88 mcg daily.  Denies any symptoms of hypo or hyperthyroidism. TSH is elevated. Lab Results  Component Value Date   TSH 7.79 (H) 04/14/2016   Lab Results  Component Value Date   CHOL 199 04/14/2016   HDL 75.20 04/14/2016   LDLCALC 111 (H) 04/14/2016   LDLDIRECT 127.0 03/13/2013   TRIG 67.0 04/14/2016   CHOLHDL 3 04/14/2016   Lab Results  Component Value Date   CREATININE 0.73 04/14/2016   Lab Results  Component Value Date   WBC 3.6 (L) 04/14/2016   HGB 13.2 04/14/2016   HCT 38.5 04/14/2016   MCV 90.8 04/14/2016   PLT 169.0 04/14/2016       Outpatient Encounter Prescriptions as of 04/16/2016  Medication Sig Dispense Refill  . ALPRAZolam (XANAX) 0.25 MG tablet TAKE 1/2 TO 1 TABLET DAILY IF NEEDED FORANXIETY 30 tablet 0  . BIOTIN 5000 PO Take one by mouth daily     . fluticasone (FLONASE) 50 MCG/ACT nasal spray USE ONE TO TWO SPRAYS IN EACH NOSTRIL EVERYDAY IF NEEDED 16 g 3  . levothyroxine (SYNTHROID, LEVOTHROID) 88 MCG tablet TAKE 1 TABLET EVERY DAY 30 tablet 0  . losartan (COZAAR) 100 MG tablet Take 1 tablet (100 mg total) by mouth daily. 30 tablet 5  . magnesium gluconate (MAGONATE) 500 MG tablet Take 500 mg by mouth daily.    . meloxicam (MOBIC) 15 MG tablet Take 1 tablet (15 mg total) by mouth 2 (two) times daily as needed for pain. 60 tablet 0  . traZODone (DESYREL) 50 MG tablet Take 0.5-1 tablets (25-50 mg total) by mouth at bedtime as needed for sleep. 30 tablet 7   No facility-administered encounter medications on file as of 04/16/2016.     Review of Systems  Constitutional: Negative.   HENT: Negative.   Eyes: Negative.   Respiratory: Negative.   Cardiovascular: Negative.   Gastrointestinal: Negative.   Endocrine: Negative.   Genitourinary: Negative.   Musculoskeletal: Negative.   Skin: Negative.   Allergic/Immunologic: Negative.   Neurological: Negative.   Hematological: Negative.   Psychiatric/Behavioral: Positive for sleep disturbance. Negative for behavioral problems, confusion, decreased concentration, dysphoric mood, hallucinations, self-injury and suicidal ideas. The patient is not nervous/anxious and is not hyperactive.   All other systems reviewed and are negative.  Objective:   Physical Exam  BP 128/78   Pulse 67   Temp 98.1 F (36.7 C) (Oral)   Ht 5' 2.5" (1.588 m)   Wt 118 lb 8 oz (53.8 kg)   SpO2 98%   BMI 21.33 kg/m    General:  Well-developed,well-nourished,in no acute distress; alert,appropriate and cooperative throughout examination Head:  normocephalic and atraumatic.   Eyes:  vision grossly intact, pupils equal, pupils round, and pupils reactive to light.   Ears:  R ear normal and L ear normal.   Nose:  no external deformity.   Mouth:  good dentition.   Neck:  No deformities, masses, or  tenderness noted. Breasts:  No mass, nodules, thickening, tenderness, bulging, retraction, inflamation, nipple discharge or skin changes noted.   Lungs:  Normal respiratory effort, chest expands symmetrically. Lungs are clear to auscultation, no crackles or wheezes. Heart:  Normal rate and regular rhythm. S1 and S2 normal without gallop, murmur, click, rub or other extra sounds. Abdomen:  Bowel sounds positive,abdomen soft and non-tender without masses, organomegaly or hernias noted. Msk:  No deformity or scoliosis noted of thoracic or lumbar spine.   Extremities:  No clubbing, cyanosis, edema, or deformity noted with normal full range of motion of all joints.   Neurologic:  alert & oriented X3 and gait normal.   Skin:  Intact without suspicious lesions or rashes Cervical Nodes:  No lymphadenopathy noted Axillary Nodes:  No palpable lymphadenopathy Psych:  Cognition and judgment appear intact. Alert and cooperative with normal attention span and concentration. No apparent delusions, illusions, hallucinations      Assessment & Plan:

## 2016-04-16 NOTE — Addendum Note (Signed)
Addended by: Lucille Passy on: 04/16/2016 01:29 PM   Modules accepted: Orders

## 2016-04-24 ENCOUNTER — Other Ambulatory Visit (INDEPENDENT_AMBULATORY_CARE_PROVIDER_SITE_OTHER): Payer: 59

## 2016-04-24 DIAGNOSIS — Z1211 Encounter for screening for malignant neoplasm of colon: Secondary | ICD-10-CM

## 2016-04-24 LAB — FECAL OCCULT BLOOD, IMMUNOCHEMICAL: FECAL OCCULT BLD: NEGATIVE

## 2016-05-25 ENCOUNTER — Other Ambulatory Visit (INDEPENDENT_AMBULATORY_CARE_PROVIDER_SITE_OTHER): Payer: 59

## 2016-05-25 DIAGNOSIS — E039 Hypothyroidism, unspecified: Secondary | ICD-10-CM

## 2016-05-25 LAB — TSH: TSH: 1.28 u[IU]/mL (ref 0.35–4.50)

## 2016-05-25 LAB — T3, FREE: T3, Free: 2.9 pg/mL (ref 2.3–4.2)

## 2016-05-25 LAB — T4, FREE: FREE T4: 0.91 ng/dL (ref 0.60–1.60)

## 2016-05-26 ENCOUNTER — Encounter: Payer: Self-pay | Admitting: Family Medicine

## 2016-06-05 ENCOUNTER — Other Ambulatory Visit: Payer: Self-pay | Admitting: Family Medicine

## 2016-07-06 ENCOUNTER — Encounter: Payer: Self-pay | Admitting: Internal Medicine

## 2016-07-06 ENCOUNTER — Ambulatory Visit (INDEPENDENT_AMBULATORY_CARE_PROVIDER_SITE_OTHER): Payer: 59 | Admitting: Internal Medicine

## 2016-07-06 VITALS — BP 120/80 | HR 74 | Temp 98.2°F | Wt 114.0 lb

## 2016-07-06 DIAGNOSIS — B9789 Other viral agents as the cause of diseases classified elsewhere: Secondary | ICD-10-CM

## 2016-07-06 DIAGNOSIS — J069 Acute upper respiratory infection, unspecified: Secondary | ICD-10-CM | POA: Diagnosis not present

## 2016-07-06 MED ORDER — BENZONATATE 200 MG PO CAPS: 200.0000 mg | ORAL_CAPSULE | Freq: Three times a day (TID) | ORAL | 0 refills | Status: DC | PRN

## 2016-07-06 MED ORDER — HYDROCODONE-HOMATROPINE 5-1.5 MG/5ML PO SYRP: 5.0000 mL | ORAL_SOLUTION | Freq: Every evening | ORAL | 0 refills | Status: DC | PRN

## 2016-07-06 NOTE — Patient Instructions (Signed)
Cough, Adult Introduction A cough helps to clear your throat and lungs. A cough may last only 2-3 weeks (acute), or it may last longer than 8 weeks (chronic). Many different things can cause a cough. A cough may be a sign of an illness or another medical condition. Follow these instructions at home:  Pay attention to any changes in your cough.  Take medicines only as told by your doctor.  If you were prescribed an antibiotic medicine, take it as told by your doctor. Do not stop taking it even if you start to feel better.  Talk with your doctor before you try using a cough medicine.  Drink enough fluid to keep your pee (urine) clear or pale yellow.  If the air is dry, use a cold steam vaporizer or humidifier in your home.  Stay away from things that make you cough at work or at home.  If your cough is worse at night, try using extra pillows to raise your head up higher while you sleep.  Do not smoke, and try not to be around smoke. If you need help quitting, ask your doctor.  Do not have caffeine.  Do not drink alcohol.  Rest as needed. Contact a doctor if:  You have new problems (symptoms).  You cough up yellow fluid (pus).  Your cough does not get better after 2-3 weeks, or your cough gets worse.  Medicine does not help your cough and you are not sleeping well.  You have pain that gets worse or pain that is not helped with medicine.  You have a fever.  You are losing weight and you do not know why.  You have night sweats. Get help right away if:  You cough up blood.  You have trouble breathing.  Your heartbeat is very fast. This information is not intended to replace advice given to you by your health care provider. Make sure you discuss any questions you have with your health care provider. Document Released: 04/16/2011 Document Revised: 01/09/2016 Document Reviewed: 10/10/2014  2017 Elsevier  

## 2016-07-06 NOTE — Progress Notes (Signed)
HPI  Pt presents to the clinic today with c/o runny nose and cough. This started 2 weeks ago. She is blowing clear mucous out of her nose. The cough is non productive. She denies nasal congestion, ear pain, sore throat, chest congestion or shortness of breath. She denies fever, chills or body aches. She has tried Mucinex and Delsym with minimal relief. She has no history of allergies or breathing problems. She has not had sick contacts that she is aware of.  Review of Systems        Past Medical History:  Diagnosis Date  . Hypertension   . Thyroid disease     Family History  Problem Relation Age of Onset  . Arthritis Mother     s/p hip replacement  . Hyperlipidemia Mother   . Heart disease Mother   . Hypertension Mother   . Arthritis Father   . Hyperlipidemia Father   . Heart disease Father   . Hypertension Father   . Dementia Father   . Thrombocytopenia Son   . Hearing loss Son   . Thrombocytopenia Son     Social History   Social History  . Marital status: Married    Spouse name: N/A  . Number of children: N/A  . Years of education: N/A   Occupational History  . Not on file.   Social History Main Topics  . Smoking status: Never Smoker  . Smokeless tobacco: Not on file  . Alcohol use Yes  . Drug use: No  . Sexual activity: Not on file   Other Topics Concern  . Not on file   Social History Narrative   Lives in [place] with husband. Two children 31YO, 64YO.      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  HEENT:  Positive runny nose. Denies eye redness, eye pain, pressure behind the eyes, facial pain, nasal congestion, ear pain, ringing in the ears, wax buildup or sore throat. Respiratory: Positive cough. Denies difficulty breathing or shortness of breath.  Cardiovascular: Denies chest pain, chest tightness,  palpitations or swelling in the hands or feet.   No other specific complaints in a complete review of systems (except as listed in HPI above).  Objective:   BP 120/80   Pulse 74   Temp 98.2 F (36.8 C) (Oral)   Wt 114 lb (51.7 kg)   SpO2 99%   BMI 20.52 kg/m  Wt Readings from Last 3 Encounters:  07/06/16 114 lb (51.7 kg)  04/16/16 118 lb 8 oz (53.8 kg)  04/09/15 124 lb 4 oz (56.4 kg)     General: Appears her stated age, well developed, well nourished in NAD. HEENT: Head: normal shape and size, no sinus tenderness noted; Eyes: sclera white, no icterus, conjunctiva pink; Ears: Tm's gray and intact, normal light reflex; Nose: mucosa pink and moist, septum midline; Throat/Mouth: Teeth present, mucosa pink and moist, no exudate noted, no lesions or ulcerations noted.  Neck: No cervical lymphadenopathy.  Cardiovascular: Normal rate and rhythm. S1,S2 noted.  No murmur, rubs or gallops noted.  Pulmonary/Chest: Normal effort and positive vesicular breath sounds. No respiratory distress. No wheezes, rales or ronchi noted.       Assessment & Plan:   Viral Upper Respiratory Infection with Cough:  Get some rest and drink plenty of water Start Claritin OTC  daily x 1 week eRx for Benzonate 200 mg TID prn Rx for Hycodan cough syrup  RTC as needed or if symptoms persist.   Webb Silversmith, NP

## 2017-02-26 ENCOUNTER — Other Ambulatory Visit: Payer: Self-pay | Admitting: Family Medicine

## 2017-04-14 ENCOUNTER — Other Ambulatory Visit: Payer: Self-pay | Admitting: Family Medicine

## 2017-04-14 DIAGNOSIS — Z01419 Encounter for gynecological examination (general) (routine) without abnormal findings: Secondary | ICD-10-CM

## 2017-04-14 DIAGNOSIS — I10 Essential (primary) hypertension: Secondary | ICD-10-CM

## 2017-04-14 DIAGNOSIS — E039 Hypothyroidism, unspecified: Secondary | ICD-10-CM

## 2017-04-15 ENCOUNTER — Other Ambulatory Visit: Payer: 59

## 2017-04-20 ENCOUNTER — Encounter: Payer: 59 | Admitting: Family Medicine

## 2017-04-22 ENCOUNTER — Other Ambulatory Visit (INDEPENDENT_AMBULATORY_CARE_PROVIDER_SITE_OTHER): Payer: PPO

## 2017-04-22 ENCOUNTER — Other Ambulatory Visit: Payer: Self-pay | Admitting: Family Medicine

## 2017-04-22 DIAGNOSIS — Z01419 Encounter for gynecological examination (general) (routine) without abnormal findings: Secondary | ICD-10-CM

## 2017-04-22 DIAGNOSIS — E039 Hypothyroidism, unspecified: Secondary | ICD-10-CM

## 2017-04-22 LAB — CBC WITH DIFFERENTIAL/PLATELET
Basophils Absolute: 0 10*3/uL (ref 0.0–0.1)
Basophils Relative: 0.8 % (ref 0.0–3.0)
EOS PCT: 2.2 % (ref 0.0–5.0)
Eosinophils Absolute: 0.1 10*3/uL (ref 0.0–0.7)
HCT: 40.9 % (ref 36.0–46.0)
Hemoglobin: 14.1 g/dL (ref 12.0–15.0)
LYMPHS PCT: 18.8 % (ref 12.0–46.0)
Lymphs Abs: 0.8 10*3/uL (ref 0.7–4.0)
MCHC: 34.4 g/dL (ref 30.0–36.0)
MCV: 92.1 fl (ref 78.0–100.0)
MONO ABS: 0.3 10*3/uL (ref 0.1–1.0)
MONOS PCT: 7.5 % (ref 3.0–12.0)
Neutro Abs: 2.9 10*3/uL (ref 1.4–7.7)
Neutrophils Relative %: 70.7 % (ref 43.0–77.0)
Platelets: 190 10*3/uL (ref 150.0–400.0)
RBC: 4.44 Mil/uL (ref 3.87–5.11)
RDW: 12.6 % (ref 11.5–15.5)
WBC: 4.1 10*3/uL (ref 4.0–10.5)

## 2017-04-22 LAB — LIPID PANEL
Cholesterol: 212 mg/dL — ABNORMAL HIGH (ref 0–200)
HDL: 77.2 mg/dL (ref >39.00)
LDL Cholesterol: 120 mg/dL — ABNORMAL HIGH (ref 0–99)
NonHDL: 134.88
TRIGLYCERIDES: 72 mg/dL (ref 0.0–149.0)
Total CHOL/HDL Ratio: 3
VLDL: 14.4 mg/dL (ref 0.0–40.0)

## 2017-04-22 LAB — COMPREHENSIVE METABOLIC PANEL
ALT: 16 U/L (ref 0–35)
AST: 21 U/L (ref 0–37)
Albumin: 4.6 g/dL (ref 3.5–5.2)
Alkaline Phosphatase: 55 U/L (ref 39–117)
BUN: 9 mg/dL (ref 6–23)
CO2: 27 meq/L (ref 19–32)
Calcium: 9.5 mg/dL (ref 8.4–10.5)
Chloride: 102 mEq/L (ref 96–112)
Creatinine, Ser: 0.66 mg/dL (ref 0.40–1.20)
GFR: 95.54 mL/min (ref >60.00)
GLUCOSE: 92 mg/dL (ref 70–99)
POTASSIUM: 4.1 meq/L (ref 3.5–5.1)
SODIUM: 138 meq/L (ref 135–145)
Total Bilirubin: 1 mg/dL (ref 0.2–1.2)
Total Protein: 7.1 g/dL (ref 6.0–8.3)

## 2017-04-22 LAB — TSH: TSH: 5.3 u[IU]/mL — ABNORMAL HIGH (ref 0.35–4.50)

## 2017-04-22 LAB — T4, FREE: FREE T4: 1.21 ng/dL (ref 0.60–1.60)

## 2017-04-22 NOTE — Telephone Encounter (Signed)
Rx request for Xanax 0.25 mg  Last OV 04/16/2016 Next OV 04/28/2017 Last refilled 04/16/2016 Please advise

## 2017-04-23 ENCOUNTER — Other Ambulatory Visit: Payer: Self-pay | Admitting: Family Medicine

## 2017-04-26 MED ORDER — ALPRAZOLAM 0.25 MG PO TABS: ORAL_TABLET | ORAL | 0 refills | Status: DC

## 2017-04-26 NOTE — Telephone Encounter (Signed)
Reordered per Dr. Deborra Medina

## 2017-04-26 NOTE — Telephone Encounter (Signed)
rx faxed to pharmacy

## 2017-04-26 NOTE — Addendum Note (Signed)
Addended by: Valere Dross on: 04/26/2017 08:16 AM   Modules accepted: Orders

## 2017-04-27 ENCOUNTER — Other Ambulatory Visit: Payer: Self-pay | Admitting: Family Medicine

## 2017-04-27 ENCOUNTER — Encounter: Payer: 59 | Admitting: Family Medicine

## 2017-04-27 ENCOUNTER — Other Ambulatory Visit: Payer: Self-pay

## 2017-04-27 MED ORDER — LOSARTAN POTASSIUM 100 MG PO TABS: 100.0000 mg | ORAL_TABLET | Freq: Every day | ORAL | 0 refills | Status: DC

## 2017-04-28 ENCOUNTER — Ambulatory Visit (INDEPENDENT_AMBULATORY_CARE_PROVIDER_SITE_OTHER): Payer: PPO | Admitting: Family Medicine

## 2017-04-28 ENCOUNTER — Encounter: Payer: Self-pay | Admitting: Family Medicine

## 2017-04-28 VITALS — BP 104/72 | HR 79 | Temp 98.1°F | Resp 16 | Ht 62.5 in | Wt 112.8 lb

## 2017-04-28 DIAGNOSIS — I10 Essential (primary) hypertension: Secondary | ICD-10-CM

## 2017-04-28 DIAGNOSIS — E039 Hypothyroidism, unspecified: Secondary | ICD-10-CM | POA: Diagnosis not present

## 2017-04-28 DIAGNOSIS — Z1239 Encounter for other screening for malignant neoplasm of breast: Secondary | ICD-10-CM

## 2017-04-28 DIAGNOSIS — G4700 Insomnia, unspecified: Secondary | ICD-10-CM | POA: Diagnosis not present

## 2017-04-28 DIAGNOSIS — Z0000 Encounter for general adult medical examination without abnormal findings: Secondary | ICD-10-CM | POA: Insufficient documentation

## 2017-04-28 DIAGNOSIS — Z780 Asymptomatic menopausal state: Secondary | ICD-10-CM | POA: Diagnosis not present

## 2017-04-28 MED ORDER — LOSARTAN POTASSIUM 100 MG PO TABS: 100.0000 mg | ORAL_TABLET | Freq: Every day | ORAL | 3 refills | Status: DC

## 2017-04-28 MED ORDER — LEVOTHYROXINE SODIUM 100 MCG PO TABS: 100.0000 ug | ORAL_TABLET | Freq: Every day | ORAL | 3 refills | Status: DC

## 2017-04-28 MED ORDER — TRAZODONE HCL 150 MG PO TABS: ORAL_TABLET | ORAL | 6 refills | Status: DC

## 2017-04-28 MED ORDER — ALPRAZOLAM 0.25 MG PO TABS: ORAL_TABLET | ORAL | 0 refills | Status: DC

## 2017-04-28 NOTE — Progress Notes (Signed)
Subjective:    Patient ID: Sandra [name], female    DOB: [birth year], 66 y.o.   MRN: [id number]  HPI Very pleasant 66 year old female with history of anxiety, hypothyroidism here for Welcome to medicare physical and follow up of chronic medical conditions.  I have personally reviewed the Medicare Annual Wellness questionnaire and have noted 1. The patient's medical and social history 2. Their use of alcohol, tobacco or illicit drugs 3. Their current medications and supplements 4. The patient's functional ability including ADL's, fall risks, home safety risks and hearing or visual             impairment. 5. Diet and physical activities 6. Evidence for depression or mood disorders  End of life wishes discussed and updated in Social History.  The roster of all physicians providing medical care to patient - is listed in the Care[facility] section of the chart.    G2P2- no h/o abnormal pap smears.  Last pap smear done by me on 04/09/15. LMP 10 years ago. No h/o post menopausal bleeding.    Mammogram 02/17/16  Colonoscopy UTD ( Dr. Vira Agar) on 12/13/09 (scanned in [place]). NO blood in stool or changes in bowel habits. Neg IFOB 04/24/16  HTN- well controlled on Cozaar 100 mg daily. Denies any HA, blurred vision, CP or SOB.  Insomnia- deteriorated. Currently taking Trazodone 100 mg nightly with xanax 1/2 tablet as needed.  Falling asleep but waking up a few hours later.   Hypothyroidism- On synthroid 88 mcg daily.  Denies any symptoms of hypo or hyperthyroidism. TSH is elevated. Lab Results  Component Value Date   TSH 5.30 (H) 04/22/2017   Lab Results  Component Value Date   CHOL 212 (H) 04/22/2017   HDL 77.20 04/22/2017   LDLCALC 120 (H) 04/22/2017   LDLDIRECT 127.0 03/13/2013   TRIG 72.0 04/22/2017   CHOLHDL 3 04/22/2017   Lab Results  Component Value Date   CREATININE 0.66 04/22/2017   Lab Results  Component Value Date   WBC 4.1 04/22/2017   HGB 14.1 04/22/2017   HCT  40.9 04/22/2017   MCV 92.1 04/22/2017   PLT 190.0 04/22/2017       Outpatient Encounter Prescriptions as of 04/28/2017  Medication Sig  . ALPRAZolam (XANAX) 0.25 MG tablet TAKE 1/2-1 TABLET BY MOUTH DAILY IF NEEDED FOR ANXIETY  . BIOTIN 5000 PO Take one by mouth daily  . fluticasone (FLONASE) 50 MCG/ACT nasal spray USE ONE TO TWO SPRAYS IN EACH NOSTRIL EVERYDAY IF NEEDED  . levothyroxine (SYNTHROID, LEVOTHROID) 100 MCG tablet TAKE 1 TABLET BY MOUTH DAILY  . losartan (COZAAR) 100 MG tablet Take 1 tablet (100 mg total) by mouth daily.  . magnesium gluconate (MAGONATE) 500 MG tablet Take 500 mg by mouth daily.  . meloxicam (MOBIC) 15 MG tablet Take 1 tablet (15 mg total) by mouth 2 (two) times daily as needed for pain.  . traZODone (DESYREL) 100 MG tablet 1/2- 1 tablet night as needed for insomia  . [DISCONTINUED] benzonatate (TESSALON) 200 MG capsule Take 1 capsule (200 mg total) by mouth 3 (three) times daily as needed for cough.  . [DISCONTINUED] HYDROcodone-homatropine (HYCODAN) 5-1.5 MG/5ML syrup Take 5 mLs by mouth at bedtime as needed for cough.   No facility-administered encounter medications on file as of 04/28/2017.     Review of Systems  Constitutional: Negative.   HENT: Negative.   Eyes: Negative.   Respiratory: Negative.   Cardiovascular: Negative.   Gastrointestinal: Negative.   Endocrine: Negative.  Genitourinary: Negative.   Musculoskeletal: Negative.   Skin: Negative.   Allergic/Immunologic: Negative.   Neurological: Negative.   Hematological: Negative.   Psychiatric/Behavioral: Positive for sleep disturbance. Negative for behavioral problems, confusion, decreased concentration, dysphoric mood, hallucinations, self-injury and suicidal ideas. The patient is not nervous/anxious and is not hyperactive.   All other systems reviewed and are negative.        Objective:   Physical Exam  BP 104/72   Pulse 79   Temp 98.1 F (36.7 C) (Oral)   Resp 16   Ht 5'  2.5" (1.588 m)   Wt 112 lb 12.8 oz (51.2 kg)   SpO2 98%   BMI 20.30 kg/m     General:  Well-developed,well-nourished,in no acute distress; alert,appropriate and cooperative throughout examination Head:  normocephalic and atraumatic.   Eyes:  vision grossly intact, PERRL Ears:  R ear normal and L ear normal externally, TMs clear bilaterally Nose:  no external deformity.   Mouth:  good dentition.   Neck:  No deformities, masses, or tenderness noted. Breasts:  No mass, nodules, thickening, tenderness, bulging, retraction, inflamation, nipple discharge or skin changes noted.   Lungs:  Normal respiratory effort, chest expands symmetrically. Lungs are clear to auscultation, no crackles or wheezes. Heart:  Normal rate and regular rhythm. S1 and S2 normal without gallop, murmur, click, rub or other extra sounds. Abdomen:  Bowel sounds positive,abdomen soft and non-tender without masses, organomegaly or hernias noted. Msk:  No deformity or scoliosis noted of thoracic or lumbar spine.   Extremities:  No clubbing, cyanosis, edema, or deformity noted with normal full range of motion of all joints.   Neurologic:  alert & oriented X3 and gait normal.   Skin:  Intact without suspicious lesions or rashes Cervical Nodes:  No lymphadenopathy noted Axillary Nodes:  No palpable lymphadenopathy Psych:  Cognition and judgment appear intact. Alert and cooperative with normal attention span and concentration. No apparent delusions, illusions, hallucinations       Assessment & Plan:

## 2017-04-28 NOTE — Assessment & Plan Note (Signed)
Well controlled.  No changes made. 

## 2017-04-28 NOTE — Assessment & Plan Note (Addendum)
The patients weight, height, BMI and visual acuity have been recorded in the chart.  Cognitive function assessed.   I have made referrals, counseling and provided education to the patient based review of the above and I have provided the pt with a written personalized care plan for preventive services.  EKG- nsr  cologuard ordered.  Mammogram and DEXA ordered- pt to schedule.

## 2017-04-28 NOTE — Assessment & Plan Note (Signed)
Deteriorated. Increase trazodone to 150 mg qhs, continue as needed xanax sparingly.

## 2017-04-28 NOTE — Patient Instructions (Signed)
Great to see you.  Please call [facility] to schedule your mammogram and bone density.  Cologuard will be mailed to your home.  We are increasing your trazodone to 150 mg nightly- okay to use xanax sparingly as well.

## 2017-04-28 NOTE — Assessment & Plan Note (Signed)
Clinically euthyroid. Continue current dose of synthroid. eRx refill sent.

## 2017-06-01 ENCOUNTER — Encounter: Payer: Self-pay | Admitting: Family Medicine

## 2017-06-01 ENCOUNTER — Telehealth: Payer: Self-pay | Admitting: Family Medicine

## 2017-06-01 DIAGNOSIS — Z1231 Encounter for screening mammogram for malignant neoplasm of breast: Secondary | ICD-10-CM | POA: Diagnosis not present

## 2017-06-01 DIAGNOSIS — Z780 Asymptomatic menopausal state: Secondary | ICD-10-CM | POA: Diagnosis not present

## 2017-06-01 DIAGNOSIS — M8589 Other specified disorders of bone density and structure, multiple sites: Secondary | ICD-10-CM | POA: Diagnosis not present

## 2017-06-01 NOTE — Telephone Encounter (Signed)
Pt called inquiring about the status of the cologaurd kit that dr Deborra Medina ordered back in Sept.  Can you please call her about this.

## 2017-06-01 NOTE — Telephone Encounter (Signed)
LMOVM # to call cologuard/thx dmf

## 2017-06-02 DIAGNOSIS — Z23 Encounter for immunization: Secondary | ICD-10-CM | POA: Diagnosis not present

## 2017-06-07 ENCOUNTER — Encounter: Payer: Self-pay | Admitting: Family Medicine

## 2017-06-23 ENCOUNTER — Telehealth: Payer: Self-pay

## 2017-06-23 DIAGNOSIS — H2511 Age-related nuclear cataract, right eye: Secondary | ICD-10-CM | POA: Diagnosis not present

## 2017-06-23 NOTE — Telephone Encounter (Signed)
Copied from [place] #[id number]. Topic: General - Other >> Jun 23, 2017 12:17 PM Ivar Drape wrote: Reason for CRM:  Patient needs an order for a Cologuard Kit  Refaxed original req to [facility] asking why this has yet to be addressed/thx dmf

## 2017-07-16 ENCOUNTER — Telehealth: Payer: Self-pay | Admitting: Primary Care

## 2017-07-16 NOTE — Telephone Encounter (Signed)
Copied from [place] [id number]. Topic: Quick Communication - See Telephone Encounter >> Jul 16, 2017 11:19 AM Ahmed Prima L wrote: CRM for notification. See Telephone encounter for:  Patient is calling stating she never received the cologuard in the mail. She said that Dr. Deborra Medina was the one who wanted her to do this. She said she has been trying to address this since 11/7. The fax number for [phone] [facility]. Please advise 07/16/17.

## 2017-07-16 NOTE — Telephone Encounter (Signed)
I have refaxed the original order as well as demographics and information regarding complaint/asked that she call the [place] office in 1 week if she still has not heard anything/thx dmf

## 2017-07-23 ENCOUNTER — Other Ambulatory Visit: Payer: Self-pay | Admitting: Family Medicine

## 2017-07-27 ENCOUNTER — Encounter: Payer: Self-pay | Admitting: Family Medicine

## 2017-07-27 DIAGNOSIS — Z1212 Encounter for screening for malignant neoplasm of rectum: Secondary | ICD-10-CM | POA: Diagnosis not present

## 2017-07-27 DIAGNOSIS — Z1211 Encounter for screening for malignant neoplasm of colon: Secondary | ICD-10-CM | POA: Diagnosis not present

## 2017-08-05 LAB — COLOGUARD

## 2017-08-23 ENCOUNTER — Telehealth: Payer: Self-pay | Admitting: Primary Care

## 2017-08-23 NOTE — Telephone Encounter (Unsigned)
Copied from [place] [id number]. Topic: Quick Communication - See Telephone Encounter >> Aug 23, 2017  3:04 PM Hewitt Shorts wrote: CRM for notification. See Telephone encounter for:  Pt states she is needing her [facility] results best number  08/23/17.

## 2017-08-23 NOTE — Telephone Encounter (Signed)
Patient called in wanting to see if her results have come back. Patient said that [facility] faxed the results over on 12/20. Please notify patient if the results are back and if not, contact [facility] to see if they can be re-sent.   Thanks!

## 2017-08-24 NOTE — Telephone Encounter (Signed)
I called [facility] and they are faxing the results to [place] fax/advised me that this was negative/pt aware and has appt scheduled this month with Belenda Cruise Clark/thx dmf

## 2017-08-24 NOTE — Telephone Encounter (Signed)
See encounter/pt aware/thx dmf

## 2017-08-24 NOTE — Telephone Encounter (Signed)
Can you please check to see if these results have been received, and if so, contact her with the results or have them faxed to [place] so that we may do so

## 2017-08-24 NOTE — Telephone Encounter (Signed)
Colo guard has confirmed with the pt her results were to Dr Deborra Medina. Pt is anxious to hear and would liek a call back ASAP!!  Pt states her results were sent to Dr Deborra Medina on 12/20 per colo guard Please call pt at work.

## 2017-09-06 ENCOUNTER — Ambulatory Visit: Payer: PPO | Admitting: Primary Care

## 2017-09-17 ENCOUNTER — Encounter: Payer: Self-pay | Admitting: Primary Care

## 2017-09-17 ENCOUNTER — Ambulatory Visit (INDEPENDENT_AMBULATORY_CARE_PROVIDER_SITE_OTHER): Payer: PPO | Admitting: Primary Care

## 2017-09-17 ENCOUNTER — Ambulatory Visit: Payer: PPO | Admitting: Primary Care

## 2017-09-17 VITALS — BP 116/76 | HR 76 | Temp 97.8°F | Ht 62.5 in | Wt 112.5 lb

## 2017-09-17 DIAGNOSIS — G4700 Insomnia, unspecified: Secondary | ICD-10-CM

## 2017-09-17 DIAGNOSIS — I10 Essential (primary) hypertension: Secondary | ICD-10-CM

## 2017-09-17 DIAGNOSIS — E039 Hypothyroidism, unspecified: Secondary | ICD-10-CM | POA: Diagnosis not present

## 2017-09-17 DIAGNOSIS — M1990 Unspecified osteoarthritis, unspecified site: Secondary | ICD-10-CM

## 2017-09-17 LAB — TSH: TSH: 5.46 u[IU]/mL — ABNORMAL HIGH (ref 0.35–4.50)

## 2017-09-17 MED ORDER — MELOXICAM 15 MG PO TABS: 15.0000 mg | ORAL_TABLET | Freq: Every day | ORAL | 0 refills | Status: DC

## 2017-09-17 NOTE — Assessment & Plan Note (Signed)
Discussed to take Meloxicam once daily, corrected Rx in chart. Renal function in September 2018 unremarkable.

## 2017-09-17 NOTE — Patient Instructions (Addendum)
Stop by the lab prior to leaving today. I will notify you of your results once received.   Please schedule a physical with me and a wellness exam with our nurse in September 2019.   It was a pleasure meeting you!

## 2017-09-17 NOTE — Assessment & Plan Note (Addendum)
Repeat TSH pending, continue levothyroxine 100 mcg for now. 

## 2017-09-17 NOTE — Assessment & Plan Note (Signed)
Stable in the office today, continue losartan. BMP unremarkable in September 2018.

## 2017-09-17 NOTE — Progress Notes (Signed)
Subjective:    Patient ID: Sandra [name], female    DOB: 1951-06-28, 67 y.o.   MRN: [id number]  HPI  Ms. Sandra [name] is a 67 year old female who presents today to transfer care from Dr. Deborra Medina.  1) Essential Hypertension: Currently managed on losartan 100 mg. She denies dizziness, chest pain, shortness of breath.   BP Readings from Last 3 Encounters:  09/17/17 116/76  04/28/17 104/72  07/06/16 120/80     2) Hypothyroidism: Currently managed on levothyroxine 100 mcg. Her last TSH was 5.30 in September 2018, no changes to medications made. Her levothyroxine dose has not been altered in a few years.   3) Insomnia: Currently managed on trazodone 150 mg for which she takes nightly. She doesn't have difficulty falling asleep but will wake during the night at times without much difficulty falling asleep. Overall improved on Trazodone. She has not taken alprazolam in months.  4) Arthritis: Located to hands and wrists. Currently managed on Meloxicam 15 mg for which she takes once daily. She feels well managed on this dose.  Review of Systems  Respiratory: Negative for shortness of breath.   Cardiovascular: Negative for chest pain.  Musculoskeletal: Positive for arthralgias.  Neurological: Negative for dizziness and headaches.  Psychiatric/Behavioral: Positive for sleep disturbance.       Past Medical History:  Diagnosis Date  . Hypertension   . Thyroid disease      Social History   Socioeconomic History  . Marital status: Married    Spouse name: Not on file  . Number of children: Not on file  . Years of education: Not on file  . Highest education level: Not on file  Social Needs  . Financial resource strain: Not on file  . Food insecurity - worry: Not on file  . Food insecurity - inability: Not on file  . Transportation needs - medical: Not on file  . Transportation needs - non-medical: Not on file  Occupational History  . Not on file  Tobacco Use  . Smoking status:  Never Smoker  . Smokeless tobacco: Never Used  Substance and Sexual Activity  . Alcohol use: Yes  . Drug use: No  . Sexual activity: Not on file  Other Topics Concern  . Not on file  Social History Narrative   Married.   2 children, no grandchildren.   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    Has a living will    Past Surgical History:  Procedure Laterality Date  . CESAREAN SECTION     X 2    Family History  Problem Relation Age of Onset  . Arthritis Mother        s/p hip replacement  . Hyperlipidemia Mother   . Heart disease Mother   . Hypertension Mother   . Arthritis Father   . Hyperlipidemia Father   . Heart disease Father   . Hypertension Father   . Dementia Father   . Thrombocytopenia Son   . Hearing loss Son   . Thrombocytopenia Son     No Known Allergies  Current Outpatient Medications on File Prior to Visit  Medication Sig Dispense Refill  . BIOTIN 5000 PO Take one by mouth daily    . fluticasone (FLONASE) 50 MCG/ACT nasal spray USE ONE TO TWO SPRAYS IN EACH NOSTRIL EVERYDAY IF NEEDED 16 g  3  . levothyroxine (SYNTHROID, LEVOTHROID) 100 MCG tablet Take 1 tablet (100 mcg total) by mouth daily. 90 tablet 3  . losartan (COZAAR) 100 MG tablet Take 1 tablet (100 mg total) by mouth daily. 90 tablet 3  . magnesium gluconate (MAGONATE) 500 MG tablet Take 500 mg by mouth daily.    . traZODone (DESYREL) 150 MG tablet 1 tablet night as needed for insomia 30 tablet 6   No current facility-administered medications on file prior to visit.     BP 116/76   Pulse 76   Temp 97.8 F (36.6 C) (Oral)   Ht 5' 2.5" (1.588 m)   Wt 112 lb 8 oz (51 kg)   SpO2 97%   BMI 20.25 kg/m    Objective:   Physical Exam  Constitutional: She appears well-nourished.  Neck: Neck supple. No thyromegaly present.  Cardiovascular: Normal rate and regular rhythm.    Pulmonary/Chest: Effort normal and breath sounds normal.  Skin: Skin is warm and dry.          Assessment & Plan:

## 2017-09-17 NOTE — Assessment & Plan Note (Signed)
Overall improved on Trazodone, continue same. Discussed to avoid benzos.

## 2017-09-20 ENCOUNTER — Encounter: Payer: Self-pay | Admitting: Primary Care

## 2017-09-21 ENCOUNTER — Encounter: Payer: Self-pay | Admitting: Primary Care

## 2017-09-21 DIAGNOSIS — E039 Hypothyroidism, unspecified: Secondary | ICD-10-CM

## 2017-09-21 MED ORDER — LEVOTHYROXINE SODIUM 112 MCG PO TABS
ORAL_TABLET | ORAL | 1 refills | Status: DC
Start: 2017-09-21 — End: 2017-11-20

## 2017-10-21 ENCOUNTER — Other Ambulatory Visit: Payer: Self-pay | Admitting: Primary Care

## 2017-10-21 ENCOUNTER — Encounter: Payer: Self-pay | Admitting: Primary Care

## 2017-10-21 DIAGNOSIS — E039 Hypothyroidism, unspecified: Secondary | ICD-10-CM

## 2017-10-21 NOTE — Telephone Encounter (Signed)
Ok to refill? Electronically refill request for levothyroxine (SYNTHROID, LEVOTHROID) 112 MCG tablet  Last prescribed on 09/21/2017. Last seen on 09/17/2017

## 2017-10-21 NOTE — Telephone Encounter (Signed)
Patient needs repeat TSH in 2 weeks, please schedule.  I put an extra refill on her levothyroxine 112 mcg prescription in early February 2019, so she shouldn't be out.

## 2017-10-25 NOTE — Telephone Encounter (Signed)
Spoken and notified patient of Kate's comments. Patient verbalized understanding.  Patient will schedule the lab appt soon. She have to look at her work calendar.   Patient wanted to let Anda Kraft know that she is doing okay with her IBS.

## 2017-10-25 NOTE — Telephone Encounter (Signed)
Noted  

## 2017-10-26 ENCOUNTER — Encounter: Payer: Self-pay | Admitting: Primary Care

## 2017-10-27 ENCOUNTER — Other Ambulatory Visit: Payer: PPO

## 2017-10-28 ENCOUNTER — Encounter: Payer: Self-pay | Admitting: Primary Care

## 2017-10-28 ENCOUNTER — Ambulatory Visit (INDEPENDENT_AMBULATORY_CARE_PROVIDER_SITE_OTHER): Payer: PPO | Admitting: Primary Care

## 2017-10-28 VITALS — BP 112/72 | HR 70 | Temp 97.7°F | Ht 62.5 in | Wt 106.0 lb

## 2017-10-28 DIAGNOSIS — E039 Hypothyroidism, unspecified: Secondary | ICD-10-CM

## 2017-10-28 DIAGNOSIS — R197 Diarrhea, unspecified: Secondary | ICD-10-CM

## 2017-10-28 LAB — CBC WITH DIFFERENTIAL/PLATELET
Basophils Absolute: 0 10*3/uL (ref 0.0–0.1)
Basophils Relative: 0.7 % (ref 0.0–3.0)
EOS PCT: 1 % (ref 0.0–5.0)
Eosinophils Absolute: 0.1 10*3/uL (ref 0.0–0.7)
HEMATOCRIT: 41.1 % (ref 36.0–46.0)
HEMOGLOBIN: 14.1 g/dL (ref 12.0–15.0)
LYMPHS ABS: 0.8 10*3/uL (ref 0.7–4.0)
LYMPHS PCT: 15.3 % (ref 12.0–46.0)
MCHC: 34.2 g/dL (ref 30.0–36.0)
MCV: 91.3 fl (ref 78.0–100.0)
MONOS PCT: 8.6 % (ref 3.0–12.0)
Monocytes Absolute: 0.4 10*3/uL (ref 0.1–1.0)
Neutro Abs: 3.7 10*3/uL (ref 1.4–7.7)
Neutrophils Relative %: 74.4 % (ref 43.0–77.0)
Platelets: 192 10*3/uL (ref 150.0–400.0)
RBC: 4.5 Mil/uL (ref 3.87–5.11)
RDW: 12.9 % (ref 11.5–15.5)
WBC: 5 10*3/uL (ref 4.0–10.5)

## 2017-10-28 LAB — COMPREHENSIVE METABOLIC PANEL
ALK PHOS: 58 U/L (ref 39–117)
ALT: 14 U/L (ref 0–35)
AST: 15 U/L (ref 0–37)
Albumin: 4.4 g/dL (ref 3.5–5.2)
BILIRUBIN TOTAL: 0.7 mg/dL (ref 0.2–1.2)
BUN: 10 mg/dL (ref 6–23)
CALCIUM: 9.4 mg/dL (ref 8.4–10.5)
CO2: 30 mEq/L (ref 19–32)
Chloride: 104 mEq/L (ref 96–112)
Creatinine, Ser: 0.78 mg/dL (ref 0.40–1.20)
GFR: 78.66 mL/min (ref >60.00)
GLUCOSE: 91 mg/dL (ref 70–99)
POTASSIUM: 4.2 meq/L (ref 3.5–5.1)
Sodium: 140 mEq/L (ref 135–145)
TOTAL PROTEIN: 6.7 g/dL (ref 6.0–8.3)

## 2017-10-28 LAB — TSH: TSH: 0.79 u[IU]/mL (ref 0.35–4.50)

## 2017-10-28 NOTE — Addendum Note (Signed)
Addended by: Ellamae Sia on: 10/28/2017 09:16 AM   Modules accepted: Orders

## 2017-10-28 NOTE — Progress Notes (Signed)
Subjective:    Patient ID: Sandra [name], female    DOB: 23-Feb-1951, 67 y.o.   MRN: [id number]  HPI  Ms. Meditz is a 67 year old female who presents today to discuss GI issues.  She was evaluated through [facility] for complaints of "rumbling stomach", gas, diarrhea that had been present for 2 weeks. Her symptoms were determined to be secondary to IBS so she was prescribed Zantac and Bentyl. She had taken Imodium and Gas-X prior to this visit, has not taken since her e-visit.   Since her e-visit she continues to experience gas, watery diarrhea 2-3 times daily. Some days' she'll not experience diarrhea.  She denies abdominal pain, change in appetite, nausea, vomiting, bloody stools. Her symptoms began 4-5 weeks ago. She's not traveled recently, eaten anything abnormal, no recent antibiotic use. No one else in her household has these symptoms.   She stopped taking her probiotics around the Holiday's last year, initially thought this was the cause. She resumed her probiotic one month ago and hasn't noticed improvement. She was going through a lot of stress from January 2019 up to one week ago.   Review of Systems  Constitutional: Negative for appetite change, fatigue and fever.  Respiratory: Negative for shortness of breath.   Cardiovascular: Negative for chest pain.  Gastrointestinal: Positive for diarrhea. Negative for abdominal pain, blood in stool, nausea and vomiting.  Psychiatric/Behavioral:       Increased stress recently       Past Medical History:  Diagnosis Date  . Hypertension   . Thyroid disease      Social History   Socioeconomic History  . Marital status: Married    Spouse name: Not on file  . Number of children: Not on file  . Years of education: Not on file  . Highest education level: Not on file  Social Needs  . Financial resource strain: Not on file  . Food insecurity - worry: Not on file  . Food insecurity - inability: Not on file    . Transportation needs - medical: Not on file  . Transportation needs - non-medical: Not on file  Occupational History  . Not on file  Tobacco Use  . Smoking status: Never Smoker  . Smokeless tobacco: Never Used  Substance and Sexual Activity  . Alcohol use: Yes  . Drug use: No  . Sexual activity: Not on file  Other Topics Concern  . Not on file  Social History Narrative   Married.   2 children, no grandchildren.   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    Has a living will    Past Surgical History:  Procedure Laterality Date  . CESAREAN SECTION     X 2    Family History  Problem Relation Age of Onset  . Arthritis Mother        s/p hip replacement  . Hyperlipidemia Mother   . Heart disease Mother   . Hypertension Mother   . Arthritis Father   . Hyperlipidemia Father   . Heart disease Father   . Hypertension Father   . Dementia Father   . Thrombocytopenia Son   . Hearing loss Son   . Thrombocytopenia Son     No Known Allergies  Current Outpatient Medications on File Prior to Visit  Medication Sig Dispense  Refill  . BIOTIN 5000 PO Take one by mouth daily    . fluticasone (FLONASE) 50 MCG/ACT nasal spray USE ONE TO TWO SPRAYS IN EACH NOSTRIL EVERYDAY IF NEEDED 16 g 3  . levothyroxine (SYNTHROID, LEVOTHROID) 112 MCG tablet Take 1 tablet by mouth every morning on an empty stomach with a full glass of water. 30 tablet 1  . losartan (COZAAR) 100 MG tablet Take 1 tablet (100 mg total) by mouth daily. 90 tablet 3  . meloxicam (MOBIC) 15 MG tablet Take 1 tablet (15 mg total) by mouth daily. for pain 90 tablet 0  . traZODone (DESYREL) 150 MG tablet 1 tablet night as needed for insomia 30 tablet 6   No current facility-administered medications on file prior to visit.     BP 112/72   Pulse 70   Temp 97.7 F (36.5 C) (Oral)   Ht 5' 2.5" (1.588 m)   Wt  106 lb (48.1 kg)   BMI 19.08 kg/m    Objective:   Physical Exam  Constitutional: She appears well-nourished. She does not appear ill.  Neck: Neck supple.  Cardiovascular: Normal rate and regular rhythm.  Pulmonary/Chest: Effort normal and breath sounds normal.  Abdominal: Soft. Normal appearance and bowel sounds are normal. There is no tenderness.  Skin: Skin is warm and dry.          Assessment & Plan:  Diarrhea:  Present for the past 4-5 weeks, overall about the same. No improvement with bentyl and zantac prescribed by e-visit MD. Exam today with increased bowel sounds, otherwise unremarkable. She does not appear acutely ill or dehydrated.  Check CBC, CMP, Stool studies today. Will have her restart Imodium to prevent further weight loss and dehydration.  Discussed BRAT diet, importance of hydration. Will await results.  Pleas Koch, NP

## 2017-10-28 NOTE — Patient Instructions (Signed)
Stop by the lab prior to leaving today. I will notify you of your results once received.   Continue to work on staying hydrated with water to prevent dehydration. Eat bland foods to help reduce diarrhea. Take a look below.  You can use Imodium as needed, please notify me if no improvement.  It was a pleasure to see you today!   Food Choices to Help Relieve Diarrhea, Adult When you have diarrhea, the foods you eat and your eating habits are very important. Choosing the right foods and drinks can help:  Relieve diarrhea.  Replace lost fluids and nutrients.  Prevent dehydration.  What general guidelines should I follow? Relieving diarrhea  Choose foods with less than 2 g or .07 oz. of fiber per serving.  Limit fats to less than 8 tsp (38 g or 1.34 oz.) a day.  Avoid the following: ? Foods and beverages sweetened with high-fructose corn syrup, honey, or sugar alcohols such as xylitol, sorbitol, and mannitol. ? Foods that contain a lot of fat or sugar. ? Fried, greasy, or spicy foods. ? High-fiber grains, breads, and cereals. ? Raw fruits and vegetables.  Eat foods that are rich in probiotics. These foods include dairy products such as yogurt and fermented milk products. They help increase healthy bacteria in the stomach and intestines (gastrointestinal tract, or GI tract).  If you have lactose intolerance, avoid dairy products. These may make your diarrhea worse.  Take medicine to help stop diarrhea (antidiarrheal medicine) only as told by your health care provider. Replacing nutrients  Eat small meals or snacks every 3-4 hours.  Eat bland foods, such as white rice, toast, or baked potato, until your diarrhea starts to get better. Gradually reintroduce nutrient-rich foods as tolerated or as told by your health care provider. This includes: ? Well-cooked protein foods. ? Peeled, seeded, and soft-cooked fruits and vegetables. ? Low-fat dairy products.  Take vitamin and mineral  supplements as told by your health care provider. Preventing dehydration   Start by sipping water or a special solution to prevent dehydration (oral rehydration solution, ORS). Urine that is clear or pale yellow means that you are getting enough fluid.  Try to drink at least 8-10 cups of fluid each day to help replace lost fluids.  You may add other liquids in addition to water, such as clear juice or decaffeinated sports drinks, as tolerated or as told by your health care provider.  Avoid drinks with caffeine, such as coffee, tea, or soft drinks.  Avoid alcohol. What foods are recommended? The items listed may not be a complete list. Talk with your health care provider about what dietary choices are best for you. Grains White rice. White, [place], or pita breads (fresh or toasted), including plain rolls, buns, or bagels. White pasta. Saltine, soda, or graham crackers. Pretzels. Low-fiber cereal. Cooked cereals made with water (such as cornmeal, farina, or cream cereals). Plain muffins. Matzo. Melba toast. Zwieback. Vegetables Potatoes (without the skin). Most well-cooked and canned vegetables without skins or seeds. Tender lettuce. Fruits Apple sauce. Fruits canned in juice. Cooked apricots, cherries, grapefruit, peaches, pears, or plums. Fresh bananas and cantaloupe. Meats and other protein foods Baked or boiled chicken. Eggs. Tofu. Fish. Seafood. Smooth nut butters. Ground or well-cooked tender beef, ham, veal, lamb, pork, or poultry. Dairy Plain yogurt, kefir, and unsweetened liquid yogurt. Lactose-free milk, buttermilk, skim milk, or soy milk. Low-fat or nonfat hard cheese. Beverages Water. Low-calorie sports drinks. Fruit juices without pulp. Strained tomato and vegetable  juices. Decaffeinated teas. Sugar-free beverages not sweetened with sugar alcohols. Oral rehydration solutions, if approved by your health care provider. Seasoning and other foods Bouillon, broth, or soups made from  recommended foods. What foods are not recommended? The items listed may not be a complete list. Talk with your health care provider about what dietary choices are best for you. Grains Whole grain, whole wheat, bran, or rye breads, rolls, pastas, and crackers. Wild or brown rice. Whole grain or bran cereals. Barley. Oats and oatmeal. Corn tortillas or taco shells. Granola. Popcorn. Vegetables Raw vegetables. Fried vegetables. Cabbage, broccoli, Brussels sprouts, artichokes, baked beans, beet greens, corn, kale, legumes, peas, sweet potatoes, and yams. Potato skins. Cooked spinach and cabbage. Fruits Dried fruit, including raisins and dates. Raw fruits. Stewed or dried prunes. Canned fruits with syrup. Meat and other protein foods Fried or fatty meats. Deli meats. Chunky nut butters. Nuts and seeds. Beans and lentils. Berniece Salines. Hot dogs. Sausage. Dairy High-fat cheeses. Whole milk, chocolate milk, and beverages made with milk, such as milk shakes. Half-and-half. Cream. sour cream. Ice cream. Beverages Caffeinated beverages (such as coffee, tea, soda, or energy drinks). Alcoholic beverages. Fruit juices with pulp. Prune juice. Soft drinks sweetened with high-fructose corn syrup or sugar alcohols. High-calorie sports drinks. Fats and oils Butter. Cream sauces. Margarine. Salad oils. Plain salad dressings. Olives. Avocados. Mayonnaise. Sweets and desserts Sweet rolls, doughnuts, and sweet breads. Sugar-free desserts sweetened with sugar alcohols such as xylitol and sorbitol. Seasoning and other foods Honey. Hot sauce. Chili powder. Gravy. Cream-based or milk-based soups. Pancakes and waffles. Summary  When you have diarrhea, the foods you eat and your eating habits are very important.  Make sure you get at least 8-10 cups of fluid each day, or enough to keep your urine clear or pale yellow.  Eat bland foods and gradually reintroduce healthy, nutrient-rich foods as tolerated, or as told by your  health care provider.  Avoid high-fiber, fried, greasy, or spicy foods. This information is not intended to replace advice given to you by your health care provider. Make sure you discuss any questions you have with your health care provider. Document Released: 10/24/2003 Document Revised: 07/31/2016 Document Reviewed: 07/31/2016 Elsevier Interactive Patient Education  [employer].

## 2017-10-28 NOTE — Addendum Note (Signed)
Addended by: Ellamae Sia on: 10/28/2017 02:18 PM   Modules accepted: Orders

## 2017-10-29 ENCOUNTER — Ambulatory Visit: Payer: Self-pay | Admitting: Primary Care

## 2017-10-29 ENCOUNTER — Encounter: Payer: Self-pay | Admitting: Primary Care

## 2017-11-01 LAB — GASTROINTESTINAL PATHOGEN PANEL PCR
C. DIFFICILE TOX A/B, PCR: NOT DETECTED
CAMPYLOBACTER, PCR: NOT DETECTED
Cryptosporidium, PCR: NOT DETECTED
E COLI 0157, PCR: NOT DETECTED
E coli (ETEC) LT/ST PCR: NOT DETECTED
E coli (STEC) stx1/stx2, PCR: NOT DETECTED
GIARDIA LAMBLIA, PCR: NOT DETECTED
Norovirus, PCR: NOT DETECTED
Rotavirus A, PCR: NOT DETECTED
SALMONELLA, PCR: NOT DETECTED
Shigella, PCR: NOT DETECTED

## 2017-11-02 ENCOUNTER — Encounter: Payer: Self-pay | Admitting: Primary Care

## 2017-11-05 ENCOUNTER — Other Ambulatory Visit: Payer: Self-pay | Admitting: Family Medicine

## 2017-11-05 DIAGNOSIS — G4700 Insomnia, unspecified: Secondary | ICD-10-CM

## 2017-11-20 ENCOUNTER — Encounter: Payer: Self-pay | Admitting: Primary Care

## 2017-11-20 ENCOUNTER — Other Ambulatory Visit: Payer: Self-pay | Admitting: Primary Care

## 2017-11-20 DIAGNOSIS — E039 Hypothyroidism, unspecified: Secondary | ICD-10-CM

## 2017-11-20 DIAGNOSIS — M1990 Unspecified osteoarthritis, unspecified site: Secondary | ICD-10-CM

## 2017-11-20 MED ORDER — LEVOTHYROXINE SODIUM 112 MCG PO TABS: ORAL_TABLET | ORAL | 3 refills | Status: DC

## 2017-11-22 ENCOUNTER — Encounter: Payer: Self-pay | Admitting: Primary Care

## 2017-11-22 MED ORDER — MELOXICAM 15 MG PO TABS: ORAL_TABLET | ORAL | 0 refills | Status: DC

## 2017-11-22 NOTE — Telephone Encounter (Signed)
Ok to refill? Electronically refill request from [facility] for meloxicam (MOBIC) 15 MG tablet  Last prescribed by Dr Deborra Medina on 07/23/2017. Last seen by Anda Kraft on 10/28/2017

## 2017-11-22 NOTE — Telephone Encounter (Signed)
Rx sent to pharmacy   

## 2017-12-22 DIAGNOSIS — Z85828 Personal history of other malignant neoplasm of skin: Secondary | ICD-10-CM | POA: Diagnosis not present

## 2017-12-22 DIAGNOSIS — D2272 Melanocytic nevi of left lower limb, including hip: Secondary | ICD-10-CM | POA: Diagnosis not present

## 2017-12-22 DIAGNOSIS — D2271 Melanocytic nevi of right lower limb, including hip: Secondary | ICD-10-CM | POA: Diagnosis not present

## 2017-12-22 DIAGNOSIS — D2261 Melanocytic nevi of right upper limb, including shoulder: Secondary | ICD-10-CM | POA: Diagnosis not present

## 2017-12-22 DIAGNOSIS — Z08 Encounter for follow-up examination after completed treatment for malignant neoplasm: Secondary | ICD-10-CM | POA: Diagnosis not present

## 2017-12-22 DIAGNOSIS — D2262 Melanocytic nevi of left upper limb, including shoulder: Secondary | ICD-10-CM | POA: Diagnosis not present

## 2017-12-22 DIAGNOSIS — D225 Melanocytic nevi of trunk: Secondary | ICD-10-CM | POA: Diagnosis not present

## 2017-12-22 DIAGNOSIS — L821 Other seborrheic keratosis: Secondary | ICD-10-CM | POA: Diagnosis not present

## 2018-02-09 ENCOUNTER — Encounter: Payer: Self-pay | Admitting: Primary Care

## 2018-04-05 ENCOUNTER — Other Ambulatory Visit: Payer: Self-pay | Admitting: Family Medicine

## 2018-04-05 DIAGNOSIS — I10 Essential (primary) hypertension: Secondary | ICD-10-CM

## 2018-04-05 NOTE — Telephone Encounter (Signed)
Refill sent to pharmacy.   

## 2018-04-05 NOTE — Telephone Encounter (Signed)
Please see rx refill rquest. Hinton Dyer

## 2018-04-08 ENCOUNTER — Other Ambulatory Visit: Payer: Self-pay | Admitting: Primary Care

## 2018-04-08 DIAGNOSIS — G4700 Insomnia, unspecified: Secondary | ICD-10-CM

## 2018-04-15 ENCOUNTER — Other Ambulatory Visit: Payer: Self-pay | Admitting: Primary Care

## 2018-04-15 DIAGNOSIS — I10 Essential (primary) hypertension: Secondary | ICD-10-CM

## 2018-04-15 DIAGNOSIS — E039 Hypothyroidism, unspecified: Secondary | ICD-10-CM

## 2018-04-15 DIAGNOSIS — Z1159 Encounter for screening for other viral diseases: Secondary | ICD-10-CM

## 2018-04-21 ENCOUNTER — Other Ambulatory Visit (INDEPENDENT_AMBULATORY_CARE_PROVIDER_SITE_OTHER): Payer: PPO

## 2018-04-21 DIAGNOSIS — I10 Essential (primary) hypertension: Secondary | ICD-10-CM | POA: Diagnosis not present

## 2018-04-21 DIAGNOSIS — Z1159 Encounter for screening for other viral diseases: Secondary | ICD-10-CM

## 2018-04-21 DIAGNOSIS — E039 Hypothyroidism, unspecified: Secondary | ICD-10-CM | POA: Diagnosis not present

## 2018-04-21 LAB — LIPID PANEL
CHOL/HDL RATIO: 2
Cholesterol: 209 mg/dL — ABNORMAL HIGH (ref 0–200)
HDL: 88.1 mg/dL (ref >39.00)
LDL CALC: 107 mg/dL — AB (ref 0–99)
NonHDL: 121.09
Triglycerides: 70 mg/dL (ref 0.0–149.0)
VLDL: 14 mg/dL (ref 0.0–40.0)

## 2018-04-21 LAB — COMPREHENSIVE METABOLIC PANEL
ALT: 23 U/L (ref 0–35)
AST: 25 U/L (ref 0–37)
Albumin: 4.9 g/dL (ref 3.5–5.2)
Alkaline Phosphatase: 63 U/L (ref 39–117)
BUN: 14 mg/dL (ref 6–23)
CHLORIDE: 99 meq/L (ref 96–112)
CO2: 30 meq/L (ref 19–32)
CREATININE: 0.7 mg/dL (ref 0.40–1.20)
Calcium: 9.7 mg/dL (ref 8.4–10.5)
GFR: 88.99 mL/min (ref >60.00)
GLUCOSE: 91 mg/dL (ref 70–99)
Potassium: 4.6 mEq/L (ref 3.5–5.1)
SODIUM: 136 meq/L (ref 135–145)
Total Bilirubin: 1 mg/dL (ref 0.2–1.2)
Total Protein: 7.5 g/dL (ref 6.0–8.3)

## 2018-04-21 LAB — TSH: TSH: 4.61 u[IU]/mL — ABNORMAL HIGH (ref 0.35–4.50)

## 2018-04-22 LAB — HEPATITIS C ANTIBODY
HEP C AB: NONREACTIVE
SIGNAL TO CUT-OFF: 0.02 (ref <1.00)

## 2018-04-26 ENCOUNTER — Other Ambulatory Visit: Payer: PPO

## 2018-05-04 ENCOUNTER — Ambulatory Visit (INDEPENDENT_AMBULATORY_CARE_PROVIDER_SITE_OTHER): Payer: PPO | Admitting: Primary Care

## 2018-05-04 ENCOUNTER — Encounter: Payer: Self-pay | Admitting: Primary Care

## 2018-05-04 VITALS — BP 120/78 | HR 71 | Temp 98.2°F | Ht 62.5 in | Wt 104.0 lb

## 2018-05-04 DIAGNOSIS — Z1231 Encounter for screening mammogram for malignant neoplasm of breast: Secondary | ICD-10-CM | POA: Diagnosis not present

## 2018-05-04 DIAGNOSIS — E039 Hypothyroidism, unspecified: Secondary | ICD-10-CM | POA: Diagnosis not present

## 2018-05-04 DIAGNOSIS — Z0001 Encounter for general adult medical examination with abnormal findings: Secondary | ICD-10-CM | POA: Insufficient documentation

## 2018-05-04 DIAGNOSIS — M1990 Unspecified osteoarthritis, unspecified site: Secondary | ICD-10-CM

## 2018-05-04 DIAGNOSIS — N898 Other specified noninflammatory disorders of vagina: Secondary | ICD-10-CM | POA: Insufficient documentation

## 2018-05-04 DIAGNOSIS — F419 Anxiety disorder, unspecified: Secondary | ICD-10-CM | POA: Diagnosis not present

## 2018-05-04 DIAGNOSIS — G4700 Insomnia, unspecified: Secondary | ICD-10-CM

## 2018-05-04 DIAGNOSIS — Z23 Encounter for immunization: Secondary | ICD-10-CM

## 2018-05-04 DIAGNOSIS — Z1239 Encounter for other screening for malignant neoplasm of breast: Secondary | ICD-10-CM

## 2018-05-04 DIAGNOSIS — I10 Essential (primary) hypertension: Secondary | ICD-10-CM | POA: Diagnosis not present

## 2018-05-04 DIAGNOSIS — Z0000 Encounter for general adult medical examination without abnormal findings: Secondary | ICD-10-CM | POA: Insufficient documentation

## 2018-05-04 MED ORDER — LEVOTHYROXINE SODIUM 112 MCG PO TABS: ORAL_TABLET | ORAL | 3 refills | Status: DC

## 2018-05-04 MED ORDER — TRAZODONE HCL 150 MG PO TABS: ORAL_TABLET | ORAL | 3 refills | Status: DC

## 2018-05-04 MED ORDER — ZOSTER VAC RECOMB ADJUVANTED 50 MCG/0.5ML IM SUSR: 0.5000 mL | Freq: Once | INTRAMUSCULAR | 1 refills | Status: AC

## 2018-05-04 MED ORDER — LOSARTAN POTASSIUM 100 MG PO TABS: ORAL_TABLET | ORAL | 3 refills | Status: DC

## 2018-05-04 NOTE — Assessment & Plan Note (Signed)
Will get influenza vaccination at work. Due for Prevnar 13, provided today, due for Shingles vaccination and Rx provided. Mammogram due this Fall, ordered. Bone density due next year.  Overall healthy diet and does exercise regularly.  Exam unremarkable. Labs reviewed. Follow up in 1 year for CPE.

## 2018-05-04 NOTE — Assessment & Plan Note (Signed)
Will have her try OTC vaginal moisturizer. Consider estrace cream.

## 2018-05-04 NOTE — Assessment & Plan Note (Signed)
Overall stable, no increased symptoms.

## 2018-05-04 NOTE — Addendum Note (Signed)
Addended by: Jacqualin Combes on: 05/04/2018 05:20 PM   Modules accepted: Orders

## 2018-05-04 NOTE — Assessment & Plan Note (Signed)
Recent anxiety with home stressors, overall able to manage. Will continue to monitor.

## 2018-05-04 NOTE — Patient Instructions (Signed)
Try a vaginal moisturizer for vaginal dryness.  Start taking your levothyroxine with water only. Do not eat or take medications for 45 minutes.   Continue regular exercise.   Continue to eat a healthy diet.  Schedule a lab only appointment in 2 months for thyroid check.  It was a pleasure to see you today!   Preventive Care 63 Years and Older, Female Preventive care refers to lifestyle choices and visits with your health care provider that can promote health and wellness. What does preventive care include?  A yearly physical exam. This is also called an annual well check.  Dental exams once or twice a year.  Routine eye exams. Ask your health care provider how often you should have your eyes checked.  Personal lifestyle choices, including: ? Daily care of your teeth and gums. ? Regular physical activity. ? Eating a healthy diet. ? Avoiding tobacco and drug use. ? Limiting alcohol use. ? Practicing safe sex. ? Taking low-dose aspirin every day. ? Taking vitamin and mineral supplements as recommended by your health care provider. What happens during an annual well check? The services and screenings done by your health care provider during your annual well check will depend on your age, overall health, lifestyle risk factors, and family history of disease. Counseling Your health care provider may ask you questions about your:  Alcohol use.  Tobacco use.  Drug use.  Emotional well-being.  Home and relationship well-being.  Sexual activity.  Eating habits.  History of falls.  Memory and ability to understand (cognition).  Work and work Statistician.  Reproductive health.  Screening You may have the following tests or measurements:  Height, weight, and BMI.  Blood pressure.  Lipid and cholesterol levels. These may be checked every 5 years, or more frequently if you are over 33 years old.  Skin check.  Lung cancer screening. You may have this screening  every year starting at age 1 if you have a 30-pack-year history of smoking and currently smoke or have quit within the past 15 years.  Fecal occult blood test (FOBT) of the stool. You may have this test every year starting at age 81.  Flexible sigmoidoscopy or colonoscopy. You may have a sigmoidoscopy every 5 years or a colonoscopy every 10 years starting at age 18.  Hepatitis C blood test.  Hepatitis B blood test.  Sexually transmitted disease (STD) testing.  Diabetes screening. This is done by checking your blood sugar (glucose) after you have not eaten for a while (fasting). You may have this done every 1-3 years.  Bone density scan. This is done to screen for osteoporosis. You may have this done starting at age 84.  Mammogram. This may be done every 1-2 years. Talk to your health care provider about how often you should have regular mammograms.  Talk with your health care provider about your test results, treatment options, and if necessary, the need for more tests. Vaccines Your health care provider may recommend certain vaccines, such as:  Influenza vaccine. This is recommended every year.  Tetanus, diphtheria, and acellular pertussis (Tdap, Td) vaccine. You may need a Td booster every 10 years.  Varicella vaccine. You may need this if you have not been vaccinated.  Zoster vaccine. You may need this after age 63.  Measles, mumps, and rubella (MMR) vaccine. You may need at least one dose of MMR if you were born in 1957 or later. You may also need a second dose.  Pneumococcal 13-valent conjugate (PCV13)  vaccine. One dose is recommended after age 63.  Pneumococcal polysaccharide (PPSV23) vaccine. One dose is recommended after age 82.  Meningococcal vaccine. You may need this if you have certain conditions.  Hepatitis A vaccine. You may need this if you have certain conditions or if you travel or work in places where you may be exposed to hepatitis A.  Hepatitis B vaccine.  You may need this if you have certain conditions or if you travel or work in places where you may be exposed to hepatitis B.  Haemophilus influenzae type b (Hib) vaccine. You may need this if you have certain conditions.  Talk to your health care provider about which screenings and vaccines you need and how often you need them. This information is not intended to replace advice given to you by your health care provider. Make sure you discuss any questions you have with your health care provider. Document Released: 08/30/2015 Document Revised: 04/22/2016 Document Reviewed: 06/04/2015 Elsevier Interactive Patient Education  [employer].

## 2018-05-04 NOTE — Progress Notes (Signed)
Subjective:    Patient ID: Sandra [name], female    DOB: [birth year], 67 y.o.   MRN: [id number]  HPI  Sandra [name] is a 67 year old female who presents today for complete physical. She is also needing medication refills.   Immunizations: -Tetanus: Completed in 2017 -Influenza: Will get at work -Pneumonia: Due this year -Shingles: Never completed  Diet: She endorses a fair diet Breakfast: Egg, english muffin, sandwich Lunch: Nuts, cheese crackers Dinner: Take out food, meat, salad, vegetable  Snacks: Fruit, peanut butter crackers Desserts: Dark chocolate, daily Beverages: Coffee, water, red wine nightly   Exercise: She is exercising 3-4 days weekly with home videos and elliptical.  Eye exam: Completed in November 2018 Dental exam: Completes semi-annually  Colonoscopy: Cologuard in 2018, negative Dexa: Completed in 2018 Mammogram: Completed in October 2018 Hep C Screen: Negative   Wt Readings from Last 3 Encounters:  05/04/18 104 lb (47.2 kg)  10/28/17 106 lb (48.1 kg)  09/17/17 112 lb 8 oz (51 kg)   BP Readings from Last 3 Encounters:  05/04/18 120/78  10/28/17 112/72  09/17/17 116/76     Review of Systems  Constitutional: Negative for unexpected weight change.  HENT: Negative for rhinorrhea.   Respiratory: Negative for cough and shortness of breath.   Cardiovascular: Negative for chest pain.  Gastrointestinal: Negative for constipation and diarrhea.  Genitourinary: Negative for difficulty urinating.       Vaginal dryness  Musculoskeletal: Negative for myalgias.  Skin: Negative for rash.  Allergic/Immunologic: Negative for environmental allergies.  Neurological: Negative for dizziness, numbness and headaches.  Psychiatric/Behavioral:       Some anxiety recently, overall able to manage on her own.       Past Medical History:  Diagnosis Date  . Hypertension   . Thyroid disease      Social History   Socioeconomic History  . Marital status: Married      Spouse name: Not on file  . Number of children: Not on file  . Years of education: Not on file  . Highest education level: Not on file  Occupational History  . Not on file  Social Needs  . Financial resource strain: Not on file  . Food insecurity:    Worry: Not on file    Inability: Not on file  . Transportation needs:    Medical: Not on file    Non-medical: Not on file  Tobacco Use  . Smoking status: Never Smoker  . Smokeless tobacco: Never Used  Substance and Sexual Activity  . Alcohol use: Yes  . Drug use: No  . Sexual activity: Not on file  Lifestyle  . Physical activity:    Days per week: Not on file    Minutes per session: Not on file  . Stress: Not on file  Relationships  . Social connections:    Talks on phone: Not on file    Gets together: Not on file    Attends religious service: Not on file    Active member of club or organization: Not on file    Attends meetings of clubs or organizations: Not on file    Relationship status: Not on file  . Intimate partner violence:    Fear of current or ex partner: Not on file    Emotionally abused: Not on file    Physically abused: Not on file    Forced sexual activity: Not on file  Other Topics Concern  . Not on file  Social  History Narrative   Married.   2 children, no grandchildren.   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    Has a living will    Past Surgical History:  Procedure Laterality Date  . CESAREAN SECTION     X 2    Family History  Problem Relation Age of Onset  . Arthritis Mother        s/p hip replacement  . Hyperlipidemia Mother   . Heart disease Mother   . Hypertension Mother   . Arthritis Father   . Hyperlipidemia Father   . Heart disease Father   . Hypertension Father   . Dementia Father   . Thrombocytopenia Son   . Hearing loss Son   . Thrombocytopenia Son      No Known Allergies  Current Outpatient Medications on File Prior to Visit  Medication Sig Dispense Refill  . BIOTIN 5000 PO Take one by mouth daily    . fluticasone (FLONASE) 50 MCG/ACT nasal spray USE ONE TO TWO SPRAYS IN EACH NOSTRIL EVERYDAY IF NEEDED 16 g 3  . levothyroxine (SYNTHROID, LEVOTHROID) 112 MCG tablet Take 1 tablet by mouth every morning on an empty stomach with a full glass of water. 90 tablet 3  . losartan (COZAAR) 100 MG tablet Take 1 tablet by mouth once daily for blood pressure. 90 tablet 1  . Melatonin 5 MG TABS Take 5 mg by mouth at bedtime as needed.     . meloxicam (MOBIC) 15 MG tablet Take 1 tablet by mouth once daily as needed for pain. 90 tablet 0  . traZODone (DESYREL) 150 MG tablet TAKE ONE TABLET BY MOUTH AT BEDTIME IF NEEDED FOR INSOMNIA 90 tablet 1   No current facility-administered medications on file prior to visit.     BP 120/78   Pulse 71   Temp 98.2 F (36.8 C) (Oral)   Ht 5' 2.5" (1.588 m)   Wt 104 lb (47.2 kg)   SpO2 97%   BMI 18.72 kg/m    Objective:   Physical Exam  Constitutional: She is oriented to person, place, and time. She appears well-nourished.  HENT:  Mouth/Throat: No oropharyngeal exudate.  Eyes: Pupils are equal, round, and reactive to light. EOM are normal.  Neck: Neck supple. No thyromegaly present.  Cardiovascular: Normal rate and regular rhythm.  Respiratory: Effort normal and breath sounds normal.  GI: Soft. Bowel sounds are normal. There is no tenderness.  Musculoskeletal: Normal range of motion.  Neurological: She is alert and oriented to person, place, and time.  Skin: Skin is warm and dry.  Psychiatric: She has a normal mood and affect.           Assessment & Plan:

## 2018-05-04 NOTE — Assessment & Plan Note (Signed)
Stable in the office today, continue losartan 100 mg daily. BMP unremarkable.

## 2018-05-04 NOTE — Assessment & Plan Note (Signed)
She is taking levothyroxine with losartan and water, is waiting 45 minutes before eating. Discussed to take levothyroxine by itself, then may take losartan and eat 45 minutes later.  Recent TSH slightly above goal, given that she's not taking correctly will have her start taking as instructed and repeat TSH in 2 months.

## 2018-05-04 NOTE — Assessment & Plan Note (Signed)
Overall doing well on Trazodone, sometimes uses Melatonin. Refill provided today.

## 2018-06-08 DIAGNOSIS — Z23 Encounter for immunization: Secondary | ICD-10-CM | POA: Diagnosis not present

## 2018-07-20 DIAGNOSIS — Z1231 Encounter for screening mammogram for malignant neoplasm of breast: Secondary | ICD-10-CM | POA: Diagnosis not present

## 2018-07-20 LAB — HM MAMMOGRAPHY

## 2018-07-22 ENCOUNTER — Encounter: Payer: Self-pay | Admitting: Primary Care

## 2018-10-22 ENCOUNTER — Other Ambulatory Visit: Payer: Self-pay | Admitting: Family Medicine

## 2018-10-22 ENCOUNTER — Other Ambulatory Visit: Payer: Self-pay | Admitting: Primary Care

## 2018-10-22 DIAGNOSIS — I10 Essential (primary) hypertension: Secondary | ICD-10-CM

## 2018-10-22 DIAGNOSIS — M1990 Unspecified osteoarthritis, unspecified site: Secondary | ICD-10-CM

## 2018-10-24 NOTE — Telephone Encounter (Signed)
Patient was supposed to have repeat TSH in November 2019, will you please schedule her for a lab only appointment? Also please verify that she is taking her levothyroxine every morning on empty stomach with water only.  No food or other medicines for 30 minutes.  No heartburn medicine, vitamins for 4 hours.

## 2018-10-27 NOTE — Telephone Encounter (Signed)
Spoken to patient about Kate's comments yesterday 10/26/2018. Patient stated that she totally forget. She stated that it would have to wait until tax season over after April 15 but she promised she will call.

## 2018-10-27 NOTE — Telephone Encounter (Signed)
Noted  

## 2018-12-08 ENCOUNTER — Other Ambulatory Visit: Payer: Self-pay | Admitting: Family Medicine

## 2018-12-08 DIAGNOSIS — M1990 Unspecified osteoarthritis, unspecified site: Secondary | ICD-10-CM

## 2018-12-09 MED ORDER — MELOXICAM 15 MG PO TABS: ORAL_TABLET | ORAL | 0 refills | Status: DC

## 2018-12-09 NOTE — Telephone Encounter (Signed)
Pt requested Rx refill from [facility] office , PCP is listed as Alma Friendly NP. Sent to [place] for review.

## 2018-12-09 NOTE — Telephone Encounter (Signed)
Noted, refill sent to pharmacy. 

## 2019-02-15 ENCOUNTER — Other Ambulatory Visit: Payer: Self-pay | Admitting: Family Medicine

## 2019-02-15 DIAGNOSIS — M1990 Unspecified osteoarthritis, unspecified site: Secondary | ICD-10-CM

## 2019-02-15 NOTE — Telephone Encounter (Signed)
Refill of Meloxicam sent to pharmacy.

## 2019-03-13 NOTE — Telephone Encounter (Signed)
LOV 05/04/2018. No future appointments made. Last lab work in September 2019. Need lab and CPE appointments?

## 2019-03-16 ENCOUNTER — Other Ambulatory Visit: Payer: Self-pay

## 2019-03-16 ENCOUNTER — Other Ambulatory Visit (INDEPENDENT_AMBULATORY_CARE_PROVIDER_SITE_OTHER): Payer: PPO

## 2019-03-16 DIAGNOSIS — E039 Hypothyroidism, unspecified: Secondary | ICD-10-CM

## 2019-03-16 LAB — TSH: TSH: 1.43 u[IU]/mL (ref 0.35–4.50)

## 2019-04-25 ENCOUNTER — Encounter: Payer: Self-pay | Admitting: Internal Medicine

## 2019-04-25 ENCOUNTER — Ambulatory Visit (INDEPENDENT_AMBULATORY_CARE_PROVIDER_SITE_OTHER): Payer: PPO | Admitting: Internal Medicine

## 2019-04-25 ENCOUNTER — Other Ambulatory Visit: Payer: Self-pay

## 2019-04-25 VITALS — BP 118/72 | HR 73 | Temp 99.4°F | Wt 105.0 lb

## 2019-04-25 DIAGNOSIS — B029 Zoster without complications: Secondary | ICD-10-CM

## 2019-04-25 MED ORDER — VALACYCLOVIR HCL 1 G PO TABS: 1000.0000 mg | ORAL_TABLET | Freq: Three times a day (TID) | ORAL | 0 refills | Status: DC

## 2019-04-25 MED ORDER — PREDNISONE 10 MG PO TABS: ORAL_TABLET | ORAL | 0 refills | Status: DC

## 2019-04-25 MED ORDER — ZOSTER VAC RECOMB ADJUVANTED 50 MCG/0.5ML IM SUSR: 0.5000 mL | Freq: Once | INTRAMUSCULAR | 0 refills | Status: AC

## 2019-04-25 MED ORDER — GABAPENTIN 100 MG PO CAPS: 100.0000 mg | ORAL_CAPSULE | Freq: Three times a day (TID) | ORAL | 3 refills | Status: DC

## 2019-04-25 NOTE — Progress Notes (Signed)
Subjective:    Patient ID: Sandra [name], female    DOB: 07-09-1951, 68 y.o.   MRN: [id number]  HPI  Pt presents to the clinic today with c/o vaginal burning. She reports this started 1 week ago. She reports the area is tender to touch. She denies vaginal itching, discharge, odor or abnormal bleeding. She denies urinary frequency, dysuria, or blood in her urine. She denies fever, chills, nausea or low back pain. She has also noticed a rash on her right buttock. She is not sure if this is related. She has not come in contact with anything that she is allergic to. She denies changes in soaps, lotions or detergents. No one in her home has a similar rash. She has not taken anything OTC for this.  Review of Systems      Past Medical History:  Diagnosis Date  . Hypertension   . Thyroid disease     Current Outpatient Medications  Medication Sig Dispense Refill  . BIOTIN 5000 PO Take one by mouth daily    . fluticasone (FLONASE) 50 MCG/ACT nasal spray USE ONE TO TWO SPRAYS IN EACH NOSTRIL EVERYDAY IF NEEDED 16 g 3  . levothyroxine (SYNTHROID, LEVOTHROID) 112 MCG tablet Take 1 tablet by mouth every morning on an empty stomach with water. Do not eat or take medications or eat for 45 minutes. 90 tablet 3  . losartan (COZAAR) 100 MG tablet TAKE ONE TABLET BY MOUTH ONCE DAILY FOR BLOOD PRESSURE 90 tablet 1  . Melatonin 5 MG TABS Take 5 mg by mouth at bedtime as needed.     . meloxicam (MOBIC) 15 MG tablet TAKE ONE TABLET TWICE A DAY IF NEEDED FOR PAIN 60 tablet 0  . traZODone (DESYREL) 150 MG tablet TAKE ONE TABLET BY MOUTH AT BEDTIME IF NEEDED FOR INSOMNIA 90 tablet 3   No current facility-administered medications for this visit.     No Known Allergies  Family History  Problem Relation Age of Onset  . Arthritis Mother        s/p hip replacement  . Hyperlipidemia Mother   . Heart disease Mother   . Hypertension Mother   . Arthritis Father   . Hyperlipidemia Father   . Heart  disease Father   . Hypertension Father   . Dementia Father   . Thrombocytopenia Son   . Hearing loss Son   . Thrombocytopenia Son     Social History   Socioeconomic History  . Marital status: Married    Spouse name: Not on file  . Number of children: Not on file  . Years of education: Not on file  . Highest education level: Not on file  Occupational History  . Not on file  Social Needs  . Financial resource strain: Not on file  . Food insecurity    Worry: Not on file    Inability: Not on file  . Transportation needs    Medical: Not on file    Non-medical: Not on file  Tobacco Use  . Smoking status: Never Smoker  . Smokeless tobacco: Never Used  Substance and Sexual Activity  . Alcohol use: Yes  . Drug use: No  . Sexual activity: Not on file  Lifestyle  . Physical activity    Days per week: Not on file    Minutes per session: Not on file  . Stress: Not on file  Relationships  . Social connections    Talks on phone: Not on file  Gets together: Not on file    Attends religious service: Not on file    Active member of club or organization: Not on file    Attends meetings of clubs or organizations: Not on file    Relationship status: Not on file  . Intimate partner violence    Fear of current or ex partner: Not on file    Emotionally abused: Not on file    Physically abused: Not on file    Forced sexual activity: Not on file  Other Topics Concern  . Not on file  Social History Narrative   Married.   2 children, no grandchildren.   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    Has a living will     Constitutional: Denies fever, malaise, fatigue, headache or abrupt weight changes.  Respiratory: Denies difficulty breathing, shortness of breath, cough or sputum production.   Cardiovascular: Denies chest pain, chest tightness, palpitations or swelling  in the hands or feet.  Gastrointestinal: Denies abdominal pain, bloating, constipation, diarrhea or blood in the stool.  GU: Pt reports vaginal burning. Denies urgency, frequency, pain with urination, burning sensation, blood in urine, odor or discharge. Skin: Pt reports rash of right buttock. Denies ulcercations.    No other specific complaints in a complete review of systems (except as listed in HPI above).  Objective:   Physical Exam   BP 118/72   Pulse 73   Temp 99.4 F (37.4 C) (Temporal)   Wt 105 lb (47.6 kg)   SpO2 98%   BMI 18.90 kg/m  Wt Readings from Last 3 Encounters:  04/25/19 105 lb (47.6 kg)  05/04/18 104 lb (47.2 kg)  10/28/17 106 lb (48.1 kg)    General: Appears her stated age, well developed, well nourished in NAD. Skin: Blistery lesion on erythematous base starting at right labia extending up the right buttocks in a dermatomal pattern. Abdomen: Soft and nontender. Normal bowel sounds. No distention or masses noted.  Pelvic: Normal female anatomy. No discharge noted from vaginal vault.  BMET    Component Value Date/Time   NA 136 04/21/2018 0757   K 4.6 04/21/2018 0757   CL 99 04/21/2018 0757   CO2 30 04/21/2018 0757   GLUCOSE 91 04/21/2018 0757   BUN 14 04/21/2018 0757   CREATININE 0.70 04/21/2018 0757   CALCIUM 9.7 04/21/2018 0757    Lipid Panel     Component Value Date/Time   CHOL 209 (H) 04/21/2018 0757   TRIG 70.0 04/21/2018 0757   HDL 88.10 04/21/2018 0757   CHOLHDL 2 04/21/2018 0757   VLDL 14.0 04/21/2018 0757   LDLCALC 107 (H) 04/21/2018 0757    CBC    Component Value Date/Time   WBC 5.0 10/28/2017 0912   RBC 4.50 10/28/2017 0912   HGB 14.1 10/28/2017 0912   HCT 41.1 10/28/2017 0912   PLT 192.0 10/28/2017 0912   MCV 91.3 10/28/2017 0912   MCHC 34.2 10/28/2017 0912   RDW 12.9 10/28/2017 0912   LYMPHSABS 0.8 10/28/2017 0912   MONOABS 0.4 10/28/2017 0912   EOSABS 0.1 10/28/2017 0912   BASOSABS 0.0 10/28/2017 0912    Hgb A1C  No results found for: HGBA1C         Assessment & Plan:   Shingles of Labia/Buttock:  RX for Valtrex 1 gm TID x 7 days RX for  Gabapentin 100 mg TID prn - sedation caution given RX for Pred Taper x 6 days, hold Meloxicam while on this RX for Shingrix vaccine to get at pharmacy once shingles has resolved Advised her to stay away from infants, pregnant women, anyone who has undergone a transplant or is undergoing chemotherapy  Return precautions discussed Webb Silversmith, NP

## 2019-04-25 NOTE — Patient Instructions (Signed)
Shingles  Shingles is an infection. It gives you a painful skin rash and blisters that have fluid in them. Shingles is caused by the same germ (virus) that causes chickenpox. Shingles only happens in people who:  Have had chickenpox.  Have been given a shot of medicine (vaccine) to protect against chickenpox. Shingles is rare in this group. The first symptoms of shingles may be itching, tingling, or pain in an area on your skin. A rash will show on your skin a few days or weeks later. The rash is likely to be on one side of your body. The rash usually has a shape like a belt or a band. Over time, the rash turns into fluid-filled blisters. The blisters will break open, change into scabs, and dry up. Medicines may:  Help with pain and itching.  Help you get better sooner.  Help to prevent long-term problems. Follow these instructions at home: Medicines  Take over-the-counter and prescription medicines only as told by your doctor.  Put on an anti-itch cream or numbing cream where you have a rash, blisters, or scabs. Do this as told by your doctor. Helping with itching and discomfort   Put cold, wet cloths (cold compresses) on the area of the rash or blisters as told by your doctor.  Cool baths can help you feel better. Try adding baking soda or dry oatmeal to the water to lessen itching. Do not bathe in hot water. Blister and rash care  Keep your rash covered with a loose bandage (dressing).  Wear loose clothing that does not rub on your rash.  Keep your rash and blisters clean. To do this, wash the area with mild soap and cool water as told by your doctor.  Check your rash every day for signs of infection. Check for: ? More redness, swelling, or pain. ? Fluid or blood. ? Warmth. ? Pus or a bad smell.  Do not scratch your rash. Do not pick at your blisters. To help you to not scratch: ? Keep your fingernails clean and cut short. ? Wear gloves or mittens when you sleep, if  scratching is a problem. General instructions  Rest as told by your doctor.  Keep all follow-up visits as told by your doctor. This is important.  Wash your hands often with soap and water. If soap and water are not available, use hand sanitizer. Doing this lowers your chance of getting a skin infection caused by germs (bacteria).  Your infection can cause chickenpox in people who have never had chickenpox or never got a shot of chickenpox vaccine. If you have blisters that did not change into scabs yet, try not to touch other people or be around other people, especially: ? Babies. ? Pregnant women. ? Children who have areas of red, itchy, or rough skin (eczema). ? Very old people who have transplants. ? People who have a long-term (chronic) sickness, like cancer or AIDS. Contact a doctor if:  Your pain does not get better with medicine.  Your pain does not get better after the rash heals.  You have any signs of infection in the rash area. These signs include: ? More redness, swelling, or pain around the rash. ? Fluid or blood coming from the rash. ? The rash area feeling warm to the touch. ? Pus or a bad smell coming from the rash. Get help right away if:  The rash is on your face or nose.  You have pain in your face or pain by   your eye.  You lose feeling on one side of your face.  You have trouble seeing.  You have ear pain, or you have ringing in your ear.  You have a loss of taste.  Your condition gets worse. Summary  Shingles gives you a painful skin rash and blisters that have fluid in them.  Shingles is an infection. It is caused by the same germ (virus) that causes chickenpox.  Keep your rash covered with a loose bandage (dressing). Wear loose clothing that does not rub on your rash.  If you have blisters that did not change into scabs yet, try not to touch other people or be around people. This information is not intended to replace advice given to you by  your health care provider. Make sure you discuss any questions you have with your health care provider. Document Released: 01/20/2008 Document Revised: 11/25/2018 Document Reviewed: 04/07/2017 Elsevier Patient Education  2020 [employer]  

## 2019-04-26 NOTE — Telephone Encounter (Signed)
Patient called and said she's in agony.  Please respond as quickly as possible.

## 2019-05-17 ENCOUNTER — Other Ambulatory Visit: Payer: Self-pay | Admitting: Primary Care

## 2019-05-17 DIAGNOSIS — I10 Essential (primary) hypertension: Secondary | ICD-10-CM

## 2019-05-18 ENCOUNTER — Ambulatory Visit (INDEPENDENT_AMBULATORY_CARE_PROVIDER_SITE_OTHER): Payer: PPO

## 2019-05-18 ENCOUNTER — Ambulatory Visit: Payer: PPO

## 2019-05-18 DIAGNOSIS — Z0000 Encounter for general adult medical examination without abnormal findings: Secondary | ICD-10-CM

## 2019-05-18 NOTE — Progress Notes (Signed)
Subjective:   Sandra [name] is a 68 y.o. female who presents for Medicare Annual (Subsequent) preventive examination.  Review of Systems:    This visit is being conducted through telemedicine via telephone at the nurse health advisor's home address due to the COVID-19 pandemic. This patient has given me verbal consent via doximity to conduct this visit, patient states they are participating from their home address. Some vital signs may be absent or patient reported.    Patient identification: identified by name, DOB, and current address  Cardiac Risk Factors include: advanced age (>46men, >39 women);hypertension     Objective:     Vitals: There were no vitals taken for this visit.  There is no height or weight on file to calculate BMI.  Advanced Directives 05/18/2019  Does Patient Have a Medical Advance Directive? Yes  Type of Paramedic of [place];Living will  Copy of [place] in Chart? No - copy requested    Tobacco Social History   Tobacco Use  Smoking Status Never Smoker  Smokeless Tobacco Never Used     Counseling given: Not Answered   Clinical Intake:  Pre-visit preparation completed: Yes  Pain : No/denies pain     Nutritional Risks: None Diabetes: No  How often do you need to have someone help you when you read instructions, pamphlets, or other written materials from your doctor or pharmacy?: 1 - Never What is the last grade level you completed in school?: [employer] graduate  Interpreter Needed?: No  Information entered by :: CJohnson, LPN  Past Medical History:  Diagnosis Date  . Hypertension   . Thyroid disease    Past Surgical History:  Procedure Laterality Date  . CESAREAN SECTION     X 2   Family History  Problem Relation Age of Onset  . Arthritis Mother        s/p hip replacement  . Hyperlipidemia Mother   . Heart disease Mother   . Hypertension Mother   . Arthritis Father   .  Hyperlipidemia Father   . Heart disease Father   . Hypertension Father   . Dementia Father   . Thrombocytopenia Son   . Hearing loss Son   . Thrombocytopenia Son    Social History   Socioeconomic History  . Marital status: Married    Spouse name: Not on file  . Number of children: Not on file  . Years of education: Not on file  . Highest education level: Not on file  Occupational History  . Not on file  Social Needs  . Financial resource strain: Not hard at all  . Food insecurity    Worry: Never true    Inability: Never true  . Transportation needs    Medical: No    Non-medical: No  Tobacco Use  . Smoking status: Never Smoker  . Smokeless tobacco: Never Used  Substance and Sexual Activity  . Alcohol use: Yes  . Drug use: No  . Sexual activity: Not on file  Lifestyle  . Physical activity    Days per week: 0 days    Minutes per session: 0 min  . Stress: Not at all  Relationships  . Social Herbalist on phone: Not on file    Gets together: Not on file    Attends religious service: Not on file    Active member of club or organization: Not on file    Attends meetings of clubs or organizations:  Not on file    Relationship status: Not on file  Other Topics Concern  . Not on file  Social History Narrative   Married.   2 children, no grandchildren.   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    Has a living will    Outpatient Encounter Medications as of 05/18/2019  Medication Sig  . BIOTIN 5000 PO Take one by mouth daily  . fluticasone (FLONASE) 50 MCG/ACT nasal spray USE ONE TO TWO SPRAYS IN EACH NOSTRIL EVERYDAY IF NEEDED  . levothyroxine (SYNTHROID, LEVOTHROID) 112 MCG tablet Take 1 tablet by mouth every morning on an empty stomach with water. Do not eat or take medications or eat for 45 minutes.  Marland Kitchen losartan (COZAAR) 100 MG tablet TAKE ONE  TABLET BY MOUTH ONCE DAILY FOR BLOOD PRESSURE  . Melatonin 5 MG TABS Take 5 mg by mouth at bedtime as needed.   . meloxicam (MOBIC) 15 MG tablet TAKE ONE TABLET TWICE A DAY IF NEEDED FOR PAIN  . traZODone (DESYREL) 150 MG tablet TAKE ONE TABLET BY MOUTH AT BEDTIME IF NEEDED FOR INSOMNIA  . gabapentin (NEURONTIN) 100 MG capsule Take 1 capsule (100 mg total) by mouth 3 (three) times daily. (Patient not taking: Reported on 05/18/2019)  . predniSONE (DELTASONE) 10 MG tablet Take 6 tabs day 1, 5 tabs day 2, 4 tabs day 3, 3 tabs day 4, 2 tabs day 5, 1 tab day 6 (Patient not taking: Reported on 05/18/2019)  . valACYclovir (VALTREX) 1000 MG tablet Take 1 tablet (1,000 mg total) by mouth 3 (three) times daily. (Patient not taking: Reported on 05/18/2019)   No facility-administered encounter medications on file as of 05/18/2019.     Activities of Daily Living In your present state of health, do you have any difficulty performing the following activities: 05/18/2019  Hearing? N  Vision? N  Difficulty concentrating or making decisions? N  Walking or climbing stairs? N  Dressing or bathing? N  Doing errands, shopping? N  Preparing Food and eating ? N  Using the Toilet? N  In the past six months, have you accidently leaked urine? N  Do you have problems with loss of bowel control? N  Managing your Medications? N  Managing your Finances? N  Housekeeping or managing your Housekeeping? N  Some recent data might be hidden    Patient Care Team: Pleas Koch, NP as PCP - General (Internal Medicine)    Assessment:   This is a routine wellness examination for [name].  Exercise Activities and Dietary recommendations Current Exercise Habits: The patient does not participate in regular exercise at present, Exercise limited by: None identified  Goals    . Patient Stated     05/18/2019, Patient states that she wants to exercise more daily.        Fall Risk Fall Risk  05/18/2019 04/28/2017  Falls  in the past year? 0 No  Risk for fall due to : Medication side effect -  Follow up Falls evaluation completed;Falls prevention discussed -   Is the patient's home free of loose throw rugs in walkways, pet beds, electrical cords, etc?   yes      Grab bars in the bathroom? no      Handrails on the stairs?   yes      Adequate lighting?   yes  Timed Get Up and Go performed: n/a  Depression Screen PHQ 2/9 Scores 05/18/2019 04/28/2017 04/09/2015  PHQ - 2 Score 0 0 1  PHQ- 9 Score 0 - -  Exception Documentation - Patient refusal -     Cognitive Function MMSE - Mini Mental State Exam 05/18/2019  Orientation to time 5  Orientation to Place 5  Registration 3  Attention/ Calculation 5  Recall 3  Language- repeat 1     Mini Cog  Mini-Cog screen was completed. Maximum score is 22. A value of 0 denotes this part of the MMSE was not completed or the patient failed this part of the Mini-Cog screening.    Immunization History  Administered Date(s) Administered  . Influenza Split 05/23/2012  . Influenza-Unspecified 05/17/2014  . Pneumococcal Conjugate-13 05/04/2018  . Tdap 04/16/2016    Qualifies for Shingles Vaccine? yes  Screening Tests Health Maintenance  Topic Date Due  . INFLUENZA VACCINE  03/18/2019  . PNA vac Low Risk Adult (2 of 2 - PPSV23) 05/05/2019  . COLONOSCOPY  06/23/2020  . MAMMOGRAM  07/20/2020  . TETANUS/TDAP  04/16/2026  . DEXA SCAN  Completed  . Hepatitis C Screening  Completed    Cancer Screenings: Lung: Low Dose CT Chest recommended if Age 23-80 years, 30 pack-year currently smoking OR have quit w/in 15years. Patient does not qualify. Breast:  Up to date on Mammogram? Yes, completed 07/20/2018  Up to date of Bone Density/Dexa? Yes,completed 06/01/2017 Colorectal: completed 06/23/2010  Additional Screenings:  Hepatitis C Screening: 04/21/2018     Plan:    Patient wants to start exercising more daily.    I have personally reviewed and noted the following in  the patient's chart:   . Medical and social history . Use of alcohol, tobacco or illicit drugs  . Current medications and supplements . Functional ability and status . Nutritional status . Physical activity . Advanced directives . List of other physicians . Hospitalizations, surgeries, and ER visits in previous 12 months . Vitals . Screenings to include cognitive, depression, and falls . Referrals and appointments  In addition, I have reviewed and discussed with patient certain preventive protocols, quality metrics, and best practice recommendations. A written personalized care plan for preventive services as well as general preventive health recommendations were provided to patient.     Andrez Grime, LPN  [id number]

## 2019-05-18 NOTE — Progress Notes (Signed)
PCP notes: none  Health Maintenance: Patient will get her pneumovax and flu vaccine at her office visit next week. Will discuss the Shingrix vaccine with provider also.     Abnormal Screenings: none    Patient concerns: Patient states that she has had a dry mouth for about 6 months now.     Nurse concerns: none    Next PCP appt.: 05/24/2019 @ 8 am

## 2019-05-18 NOTE — Patient Instructions (Signed)
Sandra [name] , Thank you for taking time to come for your Medicare Wellness Visit. I appreciate your ongoing commitment to your health goals. Please review the following plan we discussed and let me know if I can assist you in the future.   Screening recommendations/referrals: Colonoscopy: up to date, completed 06/23/2010 Mammogram: up to date, completed 07/20/2018 Bone Density: up to date, completed 06/01/2017 Recommended yearly ophthalmology/optometry visit for glaucoma screening and checkup Recommended yearly dental visit for hygiene and checkup  Vaccinations: Influenza vaccine: will get at next office visit  Pneumococcal vaccine: will get at next office visit  Tdap vaccine: up to date, completed 04/16/2016 Shingles vaccine: will discuss with provider    Advanced directives: Please bring a copy of your POA (Power of [place]) and/or Living Will to your next appointment.   Conditions/risks identified: hypertension  Next appointment: 05/24/2019 @ 8 am    Preventive Care 65 Years and Older, Female Preventive care refers to lifestyle choices and visits with your health care provider that can promote health and wellness. What does preventive care include?  A yearly physical exam. This is also called an annual well check.  Dental exams once or twice a year.  Routine eye exams. Ask your health care provider how often you should have your eyes checked.  Personal lifestyle choices, including:  Daily care of your teeth and gums.  Regular physical activity.  Eating a healthy diet.  Avoiding tobacco and drug use.  Limiting alcohol use.  Practicing safe sex.  Taking low-dose aspirin every day.  Taking vitamin and mineral supplements as recommended by your health care provider. What happens during an annual well check? The services and screenings done by your health care provider during your annual well check will depend on your age, overall health, lifestyle risk factors, and  family history of disease. Counseling  Your health care provider may ask you questions about your:  Alcohol use.  Tobacco use.  Drug use.  Emotional well-being.  Home and relationship well-being.  Sexual activity.  Eating habits.  History of falls.  Memory and ability to understand (cognition).  Work and work Statistician.  Reproductive health. Screening  You may have the following tests or measurements:  Height, weight, and BMI.  Blood pressure.  Lipid and cholesterol levels. These may be checked every 5 years, or more frequently if you are over 55 years old.  Skin check.  Lung cancer screening. You may have this screening every year starting at age 79 if you have a 30-pack-year history of smoking and currently smoke or have quit within the past 15 years.  Fecal occult blood test (FOBT) of the stool. You may have this test every year starting at age 29.  Flexible sigmoidoscopy or colonoscopy. You may have a sigmoidoscopy every 5 years or a colonoscopy every 10 years starting at age 95.  Hepatitis C blood test.  Hepatitis B blood test.  Sexually transmitted disease (STD) testing.  Diabetes screening. This is done by checking your blood sugar (glucose) after you have not eaten for a while (fasting). You may have this done every 1-3 years.  Bone density scan. This is done to screen for osteoporosis. You may have this done starting at age 76.  Mammogram. This may be done every 1-2 years. Talk to your health care provider about how often you should have regular mammograms. Talk with your health care provider about your test results, treatment options, and if necessary, the need for more tests. Vaccines  Your  health care provider may recommend certain vaccines, such as:  Influenza vaccine. This is recommended every year.  Tetanus, diphtheria, and acellular pertussis (Tdap, Td) vaccine. You may need a Td booster every 10 years.  Zoster vaccine. You may need this  after age 59.  Pneumococcal 13-valent conjugate (PCV13) vaccine. One dose is recommended after age 80.  Pneumococcal polysaccharide (PPSV23) vaccine. One dose is recommended after age 24. Talk to your health care provider about which screenings and vaccines you need and how often you need them. This information is not intended to replace advice given to you by your health care provider. Make sure you discuss any questions you have with your health care provider. Document Released: 08/30/2015 Document Revised: 04/22/2016 Document Reviewed: 06/04/2015 Elsevier Interactive Patient Education  2017 [place] Prevention in the Home Falls can cause injuries. They can happen to people of all ages. There are many things you can do to make your home safe and to help prevent falls. What can I do on the outside of my home?  Regularly fix the edges of walkways and driveways and fix any cracks.  Remove anything that might make you trip as you walk through a door, such as a raised step or threshold.  Trim any bushes or trees on the path to your home.  Use bright outdoor lighting.  Clear any walking paths of anything that might make someone trip, such as rocks or tools.  Regularly check to see if handrails are loose or broken. Make sure that both sides of any steps have handrails.  Any raised decks and porches should have guardrails on the edges.  Have any leaves, snow, or ice cleared regularly.  Use sand or salt on walking paths during winter.  Clean up any spills in your garage right away. This includes oil or grease spills. What can I do in the bathroom?  Use night lights.  Install grab bars by the toilet and in the tub and shower. Do not use towel bars as grab bars.  Use non-skid mats or decals in the tub or shower.  If you need to sit down in the shower, use a plastic, non-slip stool.  Keep the floor dry. Clean up any water that spills on the floor as soon as it happens.   Remove soap buildup in the tub or shower regularly.  Attach bath mats securely with double-sided non-slip rug tape.  Do not have throw rugs and other things on the floor that can make you trip. What can I do in the bedroom?  Use night lights.  Make sure that you have a light by your bed that is easy to reach.  Do not use any sheets or blankets that are too big for your bed. They should not hang down onto the floor.  Have a firm chair that has side arms. You can use this for support while you get dressed.  Do not have throw rugs and other things on the floor that can make you trip. What can I do in the kitchen?  Clean up any spills right away.  Avoid walking on wet floors.  Keep items that you use a lot in easy-to-reach places.  If you need to reach something above you, use a strong step stool that has a grab bar.  Keep electrical cords out of the way.  Do not use floor polish or wax that makes floors slippery. If you must use wax, use non-skid floor wax.  Do not  have throw rugs and other things on the floor that can make you trip. What can I do with my stairs?  Do not leave any items on the stairs.  Make sure that there are handrails on both sides of the stairs and use them. Fix handrails that are broken or loose. Make sure that handrails are as long as the stairways.  Check any carpeting to make sure that it is firmly attached to the stairs. Fix any carpet that is loose or worn.  Avoid having throw rugs at the top or bottom of the stairs. If you do have throw rugs, attach them to the floor with carpet tape.  Make sure that you have a light switch at the top of the stairs and the bottom of the stairs. If you do not have them, ask someone to add them for you. What else can I do to help prevent falls?  Wear shoes that:  Do not have high heels.  Have rubber bottoms.  Are comfortable and fit you well.  Are closed at the toe. Do not wear sandals.  If you use a  stepladder:  Make sure that it is fully opened. Do not climb a closed stepladder.  Make sure that both sides of the stepladder are locked into place.  Ask someone to hold it for you, if possible.  Clearly mark and make sure that you can see:  Any grab bars or handrails.  First and last steps.  Where the edge of each step is.  Use tools that help you move around (mobility aids) if they are needed. These include:  Canes.  Walkers.  Scooters.  Crutches.  Turn on the lights when you go into a dark area. Replace any light bulbs as soon as they burn out.  Set up your furniture so you have a clear path. Avoid moving your furniture around.  If any of your floors are uneven, fix them.  If there are any pets around you, be aware of where they are.  Review your medicines with your doctor. Some medicines can make you feel dizzy. This can increase your chance of falling. Ask your doctor what other things that you can do to help prevent falls. This information is not intended to replace advice given to you by your health care provider. Make sure you discuss any questions you have with your health care provider. Document Released: 05/30/2009 Document Revised: 01/09/2016 Document Reviewed: 09/07/2014 Elsevier Interactive Patient Education  2017 [employer].

## 2019-05-22 ENCOUNTER — Other Ambulatory Visit: Payer: Self-pay | Admitting: Primary Care

## 2019-05-22 DIAGNOSIS — G4700 Insomnia, unspecified: Secondary | ICD-10-CM

## 2019-05-23 ENCOUNTER — Other Ambulatory Visit: Payer: Self-pay

## 2019-05-23 ENCOUNTER — Other Ambulatory Visit (INDEPENDENT_AMBULATORY_CARE_PROVIDER_SITE_OTHER): Payer: PPO

## 2019-05-23 DIAGNOSIS — I10 Essential (primary) hypertension: Secondary | ICD-10-CM

## 2019-05-23 LAB — LIPID PANEL
Cholesterol: 190 mg/dL (ref 0–200)
HDL: 79.3 mg/dL (ref >39.00)
LDL Cholesterol: 100 mg/dL — ABNORMAL HIGH (ref 0–99)
NonHDL: 110.77
Total CHOL/HDL Ratio: 2
Triglycerides: 56 mg/dL (ref 0.0–149.0)
VLDL: 11.2 mg/dL (ref 0.0–40.0)

## 2019-05-23 LAB — CBC
HCT: 38.8 % (ref 36.0–46.0)
Hemoglobin: 13.1 g/dL (ref 12.0–15.0)
MCHC: 33.7 g/dL (ref 30.0–36.0)
MCV: 94.3 fl (ref 78.0–100.0)
Platelets: 169 10*3/uL (ref 150.0–400.0)
RBC: 4.12 Mil/uL (ref 3.87–5.11)
RDW: 13.6 % (ref 11.5–15.5)
WBC: 3.1 10*3/uL — ABNORMAL LOW (ref 4.0–10.5)

## 2019-05-23 LAB — COMPREHENSIVE METABOLIC PANEL
ALT: 18 U/L (ref 0–35)
AST: 20 U/L (ref 0–37)
Albumin: 4.5 g/dL (ref 3.5–5.2)
Alkaline Phosphatase: 64 U/L (ref 39–117)
BUN: 21 mg/dL (ref 6–23)
CO2: 29 mEq/L (ref 19–32)
Calcium: 9.4 mg/dL (ref 8.4–10.5)
Chloride: 99 mEq/L (ref 96–112)
Creatinine, Ser: 0.74 mg/dL (ref 0.40–1.20)
GFR: 78.27 mL/min (ref >60.00)
Glucose, Bld: 83 mg/dL (ref 70–99)
Potassium: 4.4 mEq/L (ref 3.5–5.1)
Sodium: 133 mEq/L — ABNORMAL LOW (ref 135–145)
Total Bilirubin: 0.8 mg/dL (ref 0.2–1.2)
Total Protein: 6.8 g/dL (ref 6.0–8.3)

## 2019-05-24 ENCOUNTER — Encounter: Payer: Self-pay | Admitting: Primary Care

## 2019-05-24 ENCOUNTER — Ambulatory Visit (INDEPENDENT_AMBULATORY_CARE_PROVIDER_SITE_OTHER): Payer: PPO | Admitting: Primary Care

## 2019-05-24 ENCOUNTER — Other Ambulatory Visit: Payer: Self-pay

## 2019-05-24 VITALS — BP 124/72 | HR 73 | Temp 98.1°F | Ht 62.5 in | Wt 106.5 lb

## 2019-05-24 DIAGNOSIS — Z0000 Encounter for general adult medical examination without abnormal findings: Secondary | ICD-10-CM

## 2019-05-24 DIAGNOSIS — E039 Hypothyroidism, unspecified: Secondary | ICD-10-CM | POA: Diagnosis not present

## 2019-05-24 DIAGNOSIS — B029 Zoster without complications: Secondary | ICD-10-CM

## 2019-05-24 DIAGNOSIS — Z23 Encounter for immunization: Secondary | ICD-10-CM | POA: Diagnosis not present

## 2019-05-24 DIAGNOSIS — M1990 Unspecified osteoarthritis, unspecified site: Secondary | ICD-10-CM

## 2019-05-24 DIAGNOSIS — Z1231 Encounter for screening mammogram for malignant neoplasm of breast: Secondary | ICD-10-CM

## 2019-05-24 DIAGNOSIS — I10 Essential (primary) hypertension: Secondary | ICD-10-CM

## 2019-05-24 DIAGNOSIS — N898 Other specified noninflammatory disorders of vagina: Secondary | ICD-10-CM | POA: Diagnosis not present

## 2019-05-24 DIAGNOSIS — G4700 Insomnia, unspecified: Secondary | ICD-10-CM

## 2019-05-24 DIAGNOSIS — E2839 Other primary ovarian failure: Secondary | ICD-10-CM | POA: Diagnosis not present

## 2019-05-24 HISTORY — DX: Zoster without complications: B02.9

## 2019-05-24 MED ORDER — TRAZODONE HCL 150 MG PO TABS: ORAL_TABLET | ORAL | 3 refills | Status: DC

## 2019-05-24 MED ORDER — MELOXICAM 15 MG PO TABS: ORAL_TABLET | ORAL | 3 refills | Status: DC

## 2019-05-24 MED ORDER — LEVOTHYROXINE SODIUM 112 MCG PO TABS: ORAL_TABLET | ORAL | 3 refills | Status: DC

## 2019-05-24 MED ORDER — LOSARTAN POTASSIUM 100 MG PO TABS: ORAL_TABLET | ORAL | 3 refills | Status: DC

## 2019-05-24 MED ORDER — ESTRADIOL 0.1 MG/GM VA CREA: TOPICAL_CREAM | VAGINAL | 0 refills | Status: DC

## 2019-05-24 NOTE — Assessment & Plan Note (Signed)
Pneumovax and influenza vaccinations due, provided today. Will provide Shingrix next year given recent episode.  Mammogram and bone density scan due, ordered. Colonoscopy due in 2021. Encouraged regular exercise, healthy diet. Exam today unremarkable. Labs reviewed.

## 2019-05-24 NOTE — Assessment & Plan Note (Signed)
Doing well with daily Meloxicam, continue same.

## 2019-05-24 NOTE — Addendum Note (Signed)
Addended by: Jacqualin Combes on: 05/24/2019 08:59 AM   Modules accepted: Orders

## 2019-05-24 NOTE — Progress Notes (Signed)
Subjective:    Patient ID: Sandra [name], female    DOB: 01-07-1951, 68 y.o.   MRN: [id number]  HPI  Sandra [name] is a 68 year old female who presents today for complete physical.  Immunizations: -Tetanus: Completed in 2017 -Influenza: Due today -Pneumonia: Completed Prevnar in 2019, due for Pneumovax -Shingles: Had shingles in September 2020, defer until next year  Diet: She endorses a healthy diet. Home cooked and take out food. She is eating grilled meats, salads, vegetables. Desserts in small portions nightly. Drinking water, wine. Exercise: She is walking some, doing some hand weights.  Eye exam: Completed over 1 year ago. Dental exam: Completes semi-annually  Colonoscopy: Completed in 2011 Mammogram: Completed in December 2019 Dexa: Completed in 2018 Hep C Screen: Negative in 2019  BP Readings from Last 3 Encounters:  05/24/19 124/72  04/25/19 118/72  05/04/18 120/78     Review of Systems  Constitutional: Negative for unexpected weight change.  HENT: Negative for rhinorrhea.   Respiratory: Negative for cough and shortness of breath.   Cardiovascular: Negative for chest pain.  Gastrointestinal: Negative for constipation and diarrhea.  Genitourinary: Negative for difficulty urinating.       Vaginal dryness  Musculoskeletal: Positive for arthralgias.  Skin: Negative for rash.  Allergic/Immunologic: Negative for environmental allergies.  Neurological: Negative for dizziness and headaches.       Residual tingling from recent herpes zoster  Psychiatric/Behavioral: The patient is not nervous/anxious.        Past Medical History:  Diagnosis Date  . Hypertension   . Thyroid disease      Social History   Socioeconomic History  . Marital status: Married    Spouse name: Not on file  . Number of children: Not on file  . Years of education: Not on file  . Highest education level: Not on file  Occupational History  . Not on file  Social Needs  . Financial  resource strain: Not hard at all  . Food insecurity    Worry: Never true    Inability: Never true  . Transportation needs    Medical: No    Non-medical: No  Tobacco Use  . Smoking status: Never Smoker  . Smokeless tobacco: Never Used  Substance and Sexual Activity  . Alcohol use: Yes  . Drug use: No  . Sexual activity: Not on file  Lifestyle  . Physical activity    Days per week: 0 days    Minutes per session: 0 min  . Stress: Not at all  Relationships  . Social Herbalist on phone: Not on file    Gets together: Not on file    Attends religious service: Not on file    Active member of club or organization: Not on file    Attends meetings of clubs or organizations: Not on file    Relationship status: Not on file  . Intimate partner violence    Fear of current or ex partner: No    Emotionally abused: No    Physically abused: No    Forced sexual activity: No  Other Topics Concern  . Not on file  Social History Narrative   Married.   2 children, no grandchildren.   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    Has a living will    Past Surgical  History:  Procedure Laterality Date  . CESAREAN SECTION     X 2    Family History  Problem Relation Age of Onset  . Arthritis Mother        s/p hip replacement  . Hyperlipidemia Mother   . Heart disease Mother   . Hypertension Mother   . Arthritis Father   . Hyperlipidemia Father   . Heart disease Father   . Hypertension Father   . Dementia Father   . Thrombocytopenia Son   . Hearing loss Son   . Thrombocytopenia Son     No Known Allergies  Current Outpatient Medications on File Prior to Visit  Medication Sig Dispense Refill  . BIOTIN 5000 PO Take one by mouth daily    . fluticasone (FLONASE) 50 MCG/ACT nasal spray USE ONE TO TWO SPRAYS IN EACH NOSTRIL EVERYDAY IF NEEDED 16 g 3  . Melatonin  5 MG TABS Take 5 mg by mouth at bedtime as needed.     . gabapentin (NEURONTIN) 100 MG capsule Take 1 capsule (100 mg total) by mouth 3 (three) times daily. (Patient not taking: Reported on 05/18/2019) 90 capsule 3   No current facility-administered medications on file prior to visit.     BP 124/72   Pulse 73   Temp 98.1 F (36.7 C) (Temporal)   Ht 5' 2.5" (1.588 m)   Wt 106 lb 8 oz (48.3 kg)   SpO2 98%   BMI 19.17 kg/m    Objective:   Physical Exam  Constitutional: She is oriented to person, place, and time. She appears well-nourished.  HENT:  Right Ear: Tympanic membrane and ear canal normal.  Left Ear: Tympanic membrane and ear canal normal.  Mouth/Throat: Oropharynx is clear and moist.  Eyes: Pupils are equal, round, and reactive to light. EOM are normal.  Neck: Neck supple.  Cardiovascular: Normal rate and regular rhythm.  Respiratory: Effort normal and breath sounds normal.  GI: Soft. Bowel sounds are normal. There is no abdominal tenderness.  Musculoskeletal: Normal range of motion.  Neurological: She is alert and oriented to person, place, and time.  Skin: Skin is warm and dry.  Psychiatric: She has a normal mood and affect.           Assessment & Plan:

## 2019-05-24 NOTE — Assessment & Plan Note (Signed)
Taking levothyroxine appropriately, continue levothyroxine 112 mcg.

## 2019-05-24 NOTE — Patient Instructions (Signed)
Continue exercising. You should be getting 150 minutes of exercise weekly.  Continue to work on a healthy diet. Ensure you are consuming 64 ounces of water daily.  Try the estrace vaginal cream. Insert 1 gram vaginally one to three times weekly for dryness.  Call the breast center to schedule your bone density and mammogram.  Schedule a lab only appointment for 6 weeks to repeat your blood cell counts.  It was a pleasure to see you today!   Preventive Care 49 Years and Older, Female Preventive care refers to lifestyle choices and visits with your health care provider that can promote health and wellness. This includes:  A yearly physical exam. This is also called an annual well check.  Regular dental and eye exams.  Immunizations.  Screening for certain conditions.  Healthy lifestyle choices, such as diet and exercise. What can I expect for my preventive care visit? Physical exam Your health care provider will check:  Height and weight. These may be used to calculate body mass index (BMI), which is a measurement that tells if you are at a healthy weight.  Heart rate and blood pressure.  Your skin for abnormal spots. Counseling Your health care provider may ask you questions about:  Alcohol, tobacco, and drug use.  Emotional well-being.  Home and relationship well-being.  Sexual activity.  Eating habits.  History of falls.  Memory and ability to understand (cognition).  Work and work Statistician.  Pregnancy and menstrual history. What immunizations do I need?  Influenza (flu) vaccine  This is recommended every year. Tetanus, diphtheria, and pertussis (Tdap) vaccine  You may need a Td booster every 10 years. Varicella (chickenpox) vaccine  You may need this vaccine if you have not already been vaccinated. Zoster (shingles) vaccine  You may need this after age 84. Pneumococcal conjugate (PCV13) vaccine  One dose is recommended after age 13.  Pneumococcal polysaccharide (PPSV23) vaccine  One dose is recommended after age 84. Measles, mumps, and rubella (MMR) vaccine  You may need at least one dose of MMR if you were born in 1957 or later. You may also need a second dose. Meningococcal conjugate (MenACWY) vaccine  You may need this if you have certain conditions. Hepatitis A vaccine  You may need this if you have certain conditions or if you travel or work in places where you may be exposed to hepatitis A. Hepatitis B vaccine  You may need this if you have certain conditions or if you travel or work in places where you may be exposed to hepatitis B. Haemophilus influenzae type b (Hib) vaccine  You may need this if you have certain conditions. You may receive vaccines as individual doses or as more than one vaccine together in one shot (combination vaccines). Talk with your health care provider about the risks and benefits of combination vaccines. What tests do I need? Blood tests  Lipid and cholesterol levels. These may be checked every 5 years, or more frequently depending on your overall health.  Hepatitis C test.  Hepatitis B test. Screening  Lung cancer screening. You may have this screening every year starting at age 15 if you have a 30-pack-year history of smoking and currently smoke or have quit within the past 15 years.  Colorectal cancer screening. All adults should have this screening starting at age 27 and continuing until age 42. Your health care provider may recommend screening at age 8 if you are at increased risk. You will have tests every 1-10 years,  depending on your results and the type of screening test.  Diabetes screening. This is done by checking your blood sugar (glucose) after you have not eaten for a while (fasting). You may have this done every 1-3 years.  Mammogram. This may be done every 1-2 years. Talk with your health care provider about how often you should have regular mammograms.   BRCA-related cancer screening. This may be done if you have a family history of breast, ovarian, tubal, or peritoneal cancers. Other tests  Sexually transmitted disease (STD) testing.  Bone density scan. This is done to screen for osteoporosis. You may have this done starting at age 62. Follow these instructions at home: Eating and drinking  Eat a diet that includes fresh fruits and vegetables, whole grains, lean protein, and low-fat dairy products. Limit your intake of foods with high amounts of sugar, saturated fats, and salt.  Take vitamin and mineral supplements as recommended by your health care provider.  Do not drink alcohol if your health care provider tells you not to drink.  If you drink alcohol: ? Limit how much you have to 0-1 drink a day. ? Be aware of how much alcohol is in your drink. In the [place] one drink equals one 12 oz bottle of beer (355 mL), one 5 oz glass of wine (148 mL), or one 1 oz glass of hard liquor (44 mL). Lifestyle  Take daily care of your teeth and gums.  Stay active. Exercise for at least 30 minutes on 5 or more days each week.  Do not use any products that contain nicotine or tobacco, such as cigarettes, e-cigarettes, and chewing tobacco. If you need help quitting, ask your health care provider.  If you are sexually active, practice safe sex. Use a condom or other form of protection in order to prevent STIs (sexually transmitted infections).  Talk with your health care provider about taking a low-dose aspirin or statin. What's next?  Go to your health care provider once a year for a well check visit.  Ask your health care provider how often you should have your eyes and teeth checked.  Stay up to date on all vaccines. This information is not intended to replace advice given to you by your health care provider. Make sure you discuss any questions you have with your health care provider. Document Released: 08/30/2015 Document Revised: 07/28/2018  Document Reviewed: 07/28/2018 Elsevier Patient Education  2020 [employer].

## 2019-05-24 NOTE — Assessment & Plan Note (Signed)
Diagnosed over Labor Day 2020, treated. Overall better, some residual tingling and is using gabapentin BID. Continue same.

## 2019-05-24 NOTE — Assessment & Plan Note (Signed)
Doing well with nightly Trazodone, continue same.

## 2019-05-24 NOTE — Assessment & Plan Note (Signed)
No improvement with OTC treatment, will trial low does estrace cream.

## 2019-07-10 ENCOUNTER — Telehealth: Payer: Self-pay | Admitting: Primary Care

## 2019-07-10 DIAGNOSIS — D72819 Decreased white blood cell count, unspecified: Secondary | ICD-10-CM

## 2019-07-10 NOTE — Telephone Encounter (Signed)
Spoken to patient and yes, patient does need lab appointment. I have schedule patent for lab on 07/11/2019.  Sandra [name], is this just a CBC?

## 2019-07-10 NOTE — Telephone Encounter (Signed)
Yes, lab ordered.

## 2019-07-10 NOTE — Telephone Encounter (Signed)
Pt called to schedule labs no order in system She stated she thought she needed to come back in 6 week white blood count labs   'pt is ok if this is not necessary

## 2019-07-11 ENCOUNTER — Other Ambulatory Visit (INDEPENDENT_AMBULATORY_CARE_PROVIDER_SITE_OTHER): Payer: PPO

## 2019-07-11 DIAGNOSIS — D72819 Decreased white blood cell count, unspecified: Secondary | ICD-10-CM

## 2019-07-11 LAB — CBC WITH DIFFERENTIAL/PLATELET
Basophils Absolute: 0 10*3/uL (ref 0.0–0.1)
Basophils Relative: 0.7 % (ref 0.0–3.0)
Eosinophils Absolute: 0.1 10*3/uL (ref 0.0–0.7)
Eosinophils Relative: 1.4 % (ref 0.0–5.0)
HCT: 39 % (ref 36.0–46.0)
Hemoglobin: 13.2 g/dL (ref 12.0–15.0)
Lymphocytes Relative: 28.3 % (ref 12.0–46.0)
Lymphs Abs: 1 10*3/uL (ref 0.7–4.0)
MCHC: 33.8 g/dL (ref 30.0–36.0)
MCV: 93.6 fl (ref 78.0–100.0)
Monocytes Absolute: 0.3 10*3/uL (ref 0.1–1.0)
Monocytes Relative: 9.5 % (ref 3.0–12.0)
Neutro Abs: 2.2 10*3/uL (ref 1.4–7.7)
Neutrophils Relative %: 60.1 % (ref 43.0–77.0)
Platelets: 190 10*3/uL (ref 150.0–400.0)
RBC: 4.17 Mil/uL (ref 3.87–5.11)
RDW: 13.1 % (ref 11.5–15.5)
WBC: 3.6 10*3/uL — ABNORMAL LOW (ref 4.0–10.5)

## 2019-07-12 DIAGNOSIS — D72819 Decreased white blood cell count, unspecified: Secondary | ICD-10-CM

## 2019-07-12 NOTE — Telephone Encounter (Signed)
Did you call patient regarding results?

## 2019-07-26 ENCOUNTER — Encounter: Payer: Self-pay | Admitting: Primary Care

## 2019-07-26 DIAGNOSIS — R29890 Loss of height: Secondary | ICD-10-CM | POA: Diagnosis not present

## 2019-07-26 DIAGNOSIS — Z1231 Encounter for screening mammogram for malignant neoplasm of breast: Secondary | ICD-10-CM | POA: Diagnosis not present

## 2019-07-26 DIAGNOSIS — M8589 Other specified disorders of bone density and structure, multiple sites: Secondary | ICD-10-CM | POA: Diagnosis not present

## 2019-07-26 DIAGNOSIS — Z803 Family history of malignant neoplasm of breast: Secondary | ICD-10-CM | POA: Diagnosis not present

## 2019-07-26 LAB — HM MAMMOGRAPHY

## 2019-07-26 LAB — HM DEXA SCAN

## 2019-07-31 ENCOUNTER — Encounter: Payer: Self-pay | Admitting: Primary Care

## 2019-08-03 ENCOUNTER — Encounter: Payer: Self-pay | Admitting: Primary Care

## 2019-08-07 NOTE — Telephone Encounter (Signed)
I spoke to [place] at [facility] and she said she'd ask the people in scanning to check for it and if there was a problem she'd call me back.

## 2019-08-07 NOTE — Telephone Encounter (Signed)
Sandra [name], I believe I sent off her mammogram and bone density scan for scanning. It's been a few weeks and I don't see it scanned. Can we find out what happened?  Sandra [name], will hematology be calling her for the referral? I placed it on 07/12/19. Thanks!

## 2019-08-08 ENCOUNTER — Telehealth: Payer: Self-pay | Admitting: Hematology

## 2019-08-08 NOTE — Telephone Encounter (Signed)
A new patient appt has been scheduled for Sandra [name] to see Dr. Irene Limbo on 12/23 at 1pm. She's been made aware to arrive 15 minutes early.

## 2019-08-09 ENCOUNTER — Inpatient Hospital Stay: Payer: PPO | Attending: Hematology | Admitting: Hematology

## 2019-08-09 ENCOUNTER — Telehealth: Payer: Self-pay | Admitting: Hematology

## 2019-08-09 ENCOUNTER — Other Ambulatory Visit: Payer: Self-pay

## 2019-08-09 ENCOUNTER — Ambulatory Visit: Payer: PPO

## 2019-08-09 ENCOUNTER — Inpatient Hospital Stay: Payer: PPO

## 2019-08-09 ENCOUNTER — Encounter: Payer: Self-pay | Admitting: Hematology

## 2019-08-09 VITALS — BP 163/99 | HR 73 | Temp 98.2°F | Resp 18 | Ht 62.5 in | Wt 103.8 lb

## 2019-08-09 DIAGNOSIS — D72819 Decreased white blood cell count, unspecified: Secondary | ICD-10-CM | POA: Diagnosis not present

## 2019-08-09 DIAGNOSIS — I10 Essential (primary) hypertension: Secondary | ICD-10-CM | POA: Diagnosis not present

## 2019-08-09 LAB — CBC WITH DIFFERENTIAL/PLATELET
Abs Immature Granulocytes: 0.01 10*3/uL (ref 0.00–0.07)
Basophils Absolute: 0 10*3/uL (ref 0.0–0.1)
Basophils Relative: 1 %
Eosinophils Absolute: 0 10*3/uL (ref 0.0–0.5)
Eosinophils Relative: 1 %
HCT: 39.5 % (ref 36.0–46.0)
Hemoglobin: 13.6 g/dL (ref 12.0–15.0)
Immature Granulocytes: 0 %
Lymphocytes Relative: 27 %
Lymphs Abs: 1.1 10*3/uL (ref 0.7–4.0)
MCH: 31.8 pg (ref 26.0–34.0)
MCHC: 34.4 g/dL (ref 30.0–36.0)
MCV: 92.3 fL (ref 80.0–100.0)
Monocytes Absolute: 0.4 10*3/uL (ref 0.1–1.0)
Monocytes Relative: 9 %
Neutro Abs: 2.4 10*3/uL (ref 1.7–7.7)
Neutrophils Relative %: 62 %
Platelets: 173 10*3/uL (ref 150–400)
RBC: 4.28 MIL/uL (ref 3.87–5.11)
RDW: 12.2 % (ref 11.5–15.5)
WBC: 3.9 10*3/uL — ABNORMAL LOW (ref 4.0–10.5)
nRBC: 0 % (ref 0.0–0.2)

## 2019-08-09 LAB — CMP (CANCER CENTER ONLY)
ALT: 20 U/L (ref 0–44)
AST: 24 U/L (ref 15–41)
Albumin: 5.1 g/dL — ABNORMAL HIGH (ref 3.5–5.0)
Alkaline Phosphatase: 79 U/L (ref 38–126)
Anion gap: 10 (ref 5–15)
BUN: 15 mg/dL (ref 8–23)
CO2: 28 mmol/L (ref 22–32)
Calcium: 9.8 mg/dL (ref 8.9–10.3)
Chloride: 99 mmol/L (ref 98–111)
Creatinine: 0.76 mg/dL (ref 0.44–1.00)
GFR, Est AFR Am: >60 mL/min (ref >60)
GFR, Estimated: >60 mL/min (ref >60)
Glucose, Bld: 90 mg/dL (ref 70–99)
Potassium: 4.5 mmol/L (ref 3.5–5.1)
Sodium: 137 mmol/L (ref 135–145)
Total Bilirubin: 1.1 mg/dL (ref 0.3–1.2)
Total Protein: 7.7 g/dL (ref 6.5–8.1)

## 2019-08-09 LAB — LACTATE DEHYDROGENASE: LDH: 167 U/L (ref 98–192)

## 2019-08-09 NOTE — Telephone Encounter (Signed)
Per 12/23 los RTC based on lab results

## 2019-08-09 NOTE — Progress Notes (Signed)
HEMATOLOGY/ONCOLOGY CONSULTATION NOTE  Date of Service: 08/09/2019  Patient Care Team: Pleas Koch, NP as PCP - General (Internal Medicine)  REFERRING PHYSICIAN: Pleas Koch, NP  CHIEF COMPLAINTS/PURPOSE OF CONSULTATION:  leukopenia  WBC 3.6 11/24 and 3.1 10/06. 3 YEARS AGO IT WAS 3.6  HISTORY OF PRESENTING ILLNESS:   Sandra [name] is a wonderful 68 y.o. female who has been referred to [place] by Pleas Koch, NP for evaluation and management of leukopenia. She is accompanied today by her husband. The pt reports that she is doing well overall.   The pt reports that she does not recall any reason for her WBC to be low in 2017.  She has had shingles on the groin area in September 2020. She was on valtrex and gabapentin as well as tapered prednisone. The cause of the shingles is unknown.   She is very active and takes Vitamin D, Biotin, zinc, melatonin.   No chemical exposure or exposure to bodily fluids.  She has never had a blood transfusion.  No weight changes or changes in energy levels  Most recent lab results (07/11/2019) of CBC is as follows: all values are WNL except for WBC at 3.6.  On review of systems, pt denies fevers, chills, night sweats, bone pains, mouth sores, back pain, abdominal pain, changes in bowel habits, and changes in urination and any other symptoms.   On PMHx the pt reports hypertension, hyperthyroidism (hormone replacement has been stable). Two Caesarean sections.   On Social Hx the pt reports never smoker. Average of two glasses of wine a night.  On Family Hx the pt reports both of her sons have ITP.   MEDICAL HISTORY:  Past Medical History:  Diagnosis Date  . Hypertension   . Thyroid disease      SURGICAL HISTORY: Past Surgical History:  Procedure Laterality Date  . CESAREAN SECTION     X 2     SOCIAL HISTORY: Social History   Socioeconomic History  . Marital status: Married    Spouse name: Not on file    . Number of children: Not on file  . Years of education: Not on file  . Highest education level: Not on file  Occupational History  . Not on file  Tobacco Use  . Smoking status: Never Smoker  . Smokeless tobacco: Never Used  Substance and Sexual Activity  . Alcohol use: Yes  . Drug use: No  . Sexual activity: Not on file  Other Topics Concern  . Not on file  Social History Narrative   Married.   2 children, no grandchildren.   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    Has a living will   Social Determinants of Health   Financial Resource Strain: Low Risk   . Difficulty of Paying Living Expenses: Not hard at all  Food Insecurity: No Food Insecurity  . Worried About Charity fundraiser in the Last Year: Never true  . Ran Out of Food in the Last Year: Never true  Transportation Needs: No Transportation Needs  . Lack of Transportation (Medical): No  . Lack of Transportation (Non-Medical): No  Physical Activity: Inactive  . Days of Exercise per Week: 0 days  . Minutes of Exercise per Session: 0 min  Stress: No Stress Concern Present  . Feeling of Stress :  Not at all  Social Connections:   . Frequency of Communication with Friends and Family: Not on file  . Frequency of Social Gatherings with Friends and Family: Not on file  . Attends Religious Services: Not on file  . Active Member of Clubs or Organizations: Not on file  . Attends Archivist Meetings: Not on file  . Marital Status: Not on file  Intimate Partner Violence: Not At Risk  . Fear of Current or Ex-Partner: No  . Emotionally Abused: No  . Physically Abused: No  . Sexually Abused: No     FAMILY HISTORY: Family History  Problem Relation Age of Onset  . Arthritis Mother        s/p hip replacement  . Hyperlipidemia Mother   . Heart disease Mother   . Hypertension Mother   .  Arthritis Father   . Hyperlipidemia Father   . Heart disease Father   . Hypertension Father   . Dementia Father   . Thrombocytopenia Son   . Hearing loss Son   . Thrombocytopenia Son      ALLERGIES:   has No Known Allergies.   MEDICATIONS:  Current Outpatient Medications  Medication Sig Dispense Refill  . BIOTIN 5000 PO Take one by mouth daily    . estradiol (ESTRACE VAGINAL) 0.1 MG/GM vaginal cream Insert 1 gram one to three times weekly. 42.5 g 0  . fluticasone (FLONASE) 50 MCG/ACT nasal spray USE ONE TO TWO SPRAYS IN EACH NOSTRIL EVERYDAY IF NEEDED 16 g 3  . gabapentin (NEURONTIN) 100 MG capsule Take 1 capsule (100 mg total) by mouth 3 (three) times daily. (Patient not taking: Reported on 05/18/2019) 90 capsule 3  . levothyroxine (SYNTHROID) 112 MCG tablet Take 1 tablet by mouth every morning on an empty stomach with water. Do not eat or take medications or eat for 45 minutes. 90 tablet 3  . losartan (COZAAR) 100 MG tablet TAKE ONE TABLET BY MOUTH ONCE DAILY FOR BLOOD PRESSURE 90 tablet 3  . Melatonin 5 MG TABS Take 5 mg by mouth at bedtime as needed.     . meloxicam (MOBIC) 15 MG tablet Take 1 tablet by mouth once daily as needed for pain. 90 tablet 3  . traZODone (DESYREL) 150 MG tablet TAKE ONE TABLET BY MOUTH AT BEDTIME IF NEEDED FOR INSOMNIA 90 tablet 3   No current facility-administered medications for this visit.     REVIEW OF SYSTEMS:   A 10+ POINT REVIEW OF SYSTEMS WAS OBTAINED including neurology, dermatology, psychiatry, cardiac, respiratory, lymph, extremities, GI, GU, Musculoskeletal, constitutional, breasts, reproductive, HEENT.  All pertinent positives are noted in the HPI.  All others are negative.    PHYSICAL EXAMINATION: ECOG PERFORMANCE STATUS: 1 - Symptomatic but completely ambulatory  Vitals:   08/09/19 1303  BP: (!) 163/99  Pulse: 73  Resp: 18  Temp: 98.2 F (36.8 C)  SpO2: 100%   Filed Weights   08/09/19 1303  Weight: 103 lb 12.8 oz (47.1 kg)    Body mass index is 18.68 kg/m.  GENERAL:alert, in no acute distress and comfortable SKIN: no acute rashes, no significant lesions EYES: conjunctiva are pink and non-injected, sclera anicteric OROPHARYNX: MMM, no exudates, no oropharyngeal erythema or ulceration NECK: supple, no JVD LYMPH:  no palpable lymphadenopathy in the cervical, axillary or inguinal regions LUNGS: clear to auscultation b/l with normal respiratory effort HEART: regular rate & rhythm ABDOMEN:  normoactive bowel sounds , non tender, not distended. Extremity: no pedal edema PSYCH:  alert & oriented x 3 with fluent speech NEURO: no focal motor/sensory deficits   LABORATORY DATA:  I have reviewed the data as listed  CBC Latest Ref Rng & Units 08/09/2019 07/11/2019 05/23/2019  WBC 4.0 - 10.5 K/uL 3.9(L) 3.6(L) 3.1(L)  Hemoglobin 12.0 - 15.0 g/dL 13.6 13.2 13.1  Hematocrit 36.0 - 46.0 % 39.5 39.0 38.8  Platelets 150 - 400 K/uL 173 190.0 169.0  ANC 2400  CMP Latest Ref Rng & Units 08/09/2019 05/23/2019 04/21/2018  Glucose 70 - 99 mg/dL 90 83 91  BUN 8 - 23 mg/dL 15 21 14   Creatinine 0.44 - 1.00 mg/dL 0.76 0.74 0.70  Sodium 135 - 145 mmol/L 137 133(L) 136  Potassium 3.5 - 5.1 mmol/L 4.5 4.4 4.6  Chloride 98 - 111 mmol/L 99 99 99  CO2 22 - 32 mmol/L 28 29 30   Calcium 8.9 - 10.3 mg/dL 9.8 9.4 9.7  Total Protein 6.5 - 8.1 g/dL 7.7 6.8 7.5  Total Bilirubin 0.3 - 1.2 mg/dL 1.1 0.8 1.0  Alkaline Phos 38 - 126 U/L 79 64 63  AST 15 - 41 U/L 24 20 25   ALT 0 - 44 U/L 20 18 23    . Lab Results  Component Value Date   LDH 167 08/09/2019    RADIOGRAPHIC STUDIES: I have personally reviewed the radiological images as listed and agreed with the findings in the report. No results found.   ASSESSMENT & PLAN:  Sandra [name] is a 68 y.o. female with:  1. Leucopenia. No neutropenia. PLAN: -Discussed patient's most recent labs from all values are WNL except for WBC at 3.6. -advised that valtrex, meloxicam  and  prednisone can cause lower WBC. Shingles flare could also contribute to low WBC. -05/23/2019 platelets at 169.0 -05/23/2019 hemoglobin at 13.1 -Will do labs today.CBC done today shows resolution of leucopenia. LDH WNL. -no further hematologic workup recommended.  FOLLOW UP Labs today RTC based on lab results  All of the patients questions were answered with apparent satisfaction. The patient knows to call the clinic with any problems, questions or concerns.  I spent 20 minutes counseling the patient face to face. The total time spent in the appointment was 30 minutes and more than 50% was on counseling and direct patient cares.    Sullivan Lone MD MS AAHIVMS [facility] Hematology/Oncology Physician [facility]  (Office):       [phone] (Work cell):  [phone] (Fax):           [phone]  08/09/2019 6:35 AM  I, Scot Dock, am acting as a scribe for Dr. Sullivan Lone.   .I have reviewed the above documentation for accuracy and completeness, and I agree with the above. Brunetta Genera MD

## 2019-08-16 ENCOUNTER — Telehealth: Payer: Self-pay | Admitting: *Deleted

## 2019-08-16 NOTE — Telephone Encounter (Addendum)
-----   Message from Brunetta Genera, MD sent at 08/16/2019 12:35 AM EST ----- Plz let patient know her WBC are WNL -- no further workup recommended at this time. Continue f/u with PCP  Contacted patient with information as above from Dr. Irene Limbo. Patient verbalized understanding.

## 2019-09-16 ENCOUNTER — Ambulatory Visit: Payer: PPO

## 2019-09-20 DIAGNOSIS — H2513 Age-related nuclear cataract, bilateral: Secondary | ICD-10-CM | POA: Diagnosis not present

## 2019-09-24 ENCOUNTER — Ambulatory Visit: Payer: PPO

## 2019-09-25 ENCOUNTER — Ambulatory Visit: Payer: PPO

## 2019-09-27 ENCOUNTER — Ambulatory Visit: Payer: PPO

## 2019-10-13 DIAGNOSIS — D2261 Melanocytic nevi of right upper limb, including shoulder: Secondary | ICD-10-CM | POA: Diagnosis not present

## 2019-10-13 DIAGNOSIS — D2262 Melanocytic nevi of left upper limb, including shoulder: Secondary | ICD-10-CM | POA: Diagnosis not present

## 2019-10-13 DIAGNOSIS — D485 Neoplasm of uncertain behavior of skin: Secondary | ICD-10-CM | POA: Diagnosis not present

## 2019-10-13 DIAGNOSIS — D2272 Melanocytic nevi of left lower limb, including hip: Secondary | ICD-10-CM | POA: Diagnosis not present

## 2019-10-13 DIAGNOSIS — D224 Melanocytic nevi of scalp and neck: Secondary | ICD-10-CM | POA: Diagnosis not present

## 2019-10-13 DIAGNOSIS — D2271 Melanocytic nevi of right lower limb, including hip: Secondary | ICD-10-CM | POA: Diagnosis not present

## 2019-10-13 DIAGNOSIS — Z85828 Personal history of other malignant neoplasm of skin: Secondary | ICD-10-CM | POA: Diagnosis not present

## 2020-02-20 ENCOUNTER — Other Ambulatory Visit: Payer: Self-pay | Admitting: Primary Care

## 2020-02-20 DIAGNOSIS — E039 Hypothyroidism, unspecified: Secondary | ICD-10-CM

## 2020-02-20 DIAGNOSIS — I10 Essential (primary) hypertension: Secondary | ICD-10-CM

## 2020-02-20 DIAGNOSIS — G4700 Insomnia, unspecified: Secondary | ICD-10-CM

## 2020-02-23 DIAGNOSIS — M1711 Unilateral primary osteoarthritis, right knee: Secondary | ICD-10-CM | POA: Diagnosis not present

## 2020-02-23 DIAGNOSIS — M1712 Unilateral primary osteoarthritis, left knee: Secondary | ICD-10-CM | POA: Diagnosis not present

## 2020-02-23 DIAGNOSIS — M25562 Pain in left knee: Secondary | ICD-10-CM | POA: Diagnosis not present

## 2020-02-23 DIAGNOSIS — M25561 Pain in right knee: Secondary | ICD-10-CM | POA: Diagnosis not present

## 2020-02-23 DIAGNOSIS — M170 Bilateral primary osteoarthritis of knee: Secondary | ICD-10-CM | POA: Diagnosis not present

## 2020-03-12 ENCOUNTER — Other Ambulatory Visit: Payer: Self-pay

## 2020-04-08 ENCOUNTER — Other Ambulatory Visit: Payer: Self-pay | Admitting: Primary Care

## 2020-04-08 DIAGNOSIS — M1990 Unspecified osteoarthritis, unspecified site: Secondary | ICD-10-CM

## 2020-05-01 ENCOUNTER — Other Ambulatory Visit: Payer: Self-pay | Admitting: Primary Care

## 2020-05-01 DIAGNOSIS — N898 Other specified noninflammatory disorders of vagina: Secondary | ICD-10-CM

## 2020-05-10 DIAGNOSIS — M25561 Pain in right knee: Secondary | ICD-10-CM | POA: Diagnosis not present

## 2020-05-13 ENCOUNTER — Telehealth: Payer: Self-pay | Admitting: Primary Care

## 2020-05-13 NOTE — Telephone Encounter (Signed)
Called pt to r/s appt with [facility] on 10/28/2. Stated she would call me back to R/S appt because she was in the store.

## 2020-06-05 DIAGNOSIS — Z23 Encounter for immunization: Secondary | ICD-10-CM | POA: Diagnosis not present

## 2020-06-12 ENCOUNTER — Other Ambulatory Visit: Payer: PPO

## 2020-06-13 ENCOUNTER — Other Ambulatory Visit: Payer: Self-pay | Admitting: Primary Care

## 2020-06-13 ENCOUNTER — Ambulatory Visit: Payer: PPO

## 2020-06-13 DIAGNOSIS — D72819 Decreased white blood cell count, unspecified: Secondary | ICD-10-CM

## 2020-06-13 DIAGNOSIS — E039 Hypothyroidism, unspecified: Secondary | ICD-10-CM

## 2020-06-13 DIAGNOSIS — I10 Essential (primary) hypertension: Secondary | ICD-10-CM

## 2020-06-14 ENCOUNTER — Encounter: Payer: PPO | Admitting: Primary Care

## 2020-06-17 ENCOUNTER — Other Ambulatory Visit: Payer: Self-pay

## 2020-06-17 ENCOUNTER — Other Ambulatory Visit (INDEPENDENT_AMBULATORY_CARE_PROVIDER_SITE_OTHER): Payer: PPO

## 2020-06-17 DIAGNOSIS — E039 Hypothyroidism, unspecified: Secondary | ICD-10-CM | POA: Diagnosis not present

## 2020-06-17 DIAGNOSIS — I10 Essential (primary) hypertension: Secondary | ICD-10-CM | POA: Diagnosis not present

## 2020-06-17 DIAGNOSIS — D72819 Decreased white blood cell count, unspecified: Secondary | ICD-10-CM | POA: Diagnosis not present

## 2020-06-17 LAB — CBC
HCT: 40.1 % (ref 36.0–46.0)
Hemoglobin: 14 g/dL (ref 12.0–15.0)
MCHC: 34.8 g/dL (ref 30.0–36.0)
MCV: 93.9 fl (ref 78.0–100.0)
Platelets: 185 10*3/uL (ref 150.0–400.0)
RBC: 4.28 Mil/uL (ref 3.87–5.11)
RDW: 12.4 % (ref 11.5–15.5)
WBC: 4.3 10*3/uL (ref 4.0–10.5)

## 2020-06-17 LAB — COMPREHENSIVE METABOLIC PANEL
ALT: 19 U/L (ref 0–35)
AST: 25 U/L (ref 0–37)
Albumin: 4.7 g/dL (ref 3.5–5.2)
Alkaline Phosphatase: 54 U/L (ref 39–117)
BUN: 17 mg/dL (ref 6–23)
CO2: 31 mEq/L (ref 19–32)
Calcium: 9.1 mg/dL (ref 8.4–10.5)
Chloride: 98 mEq/L (ref 96–112)
Creatinine, Ser: 0.75 mg/dL (ref 0.40–1.20)
GFR: 81.97 mL/min (ref >60.00)
Glucose, Bld: 87 mg/dL (ref 70–99)
Potassium: 4.7 mEq/L (ref 3.5–5.1)
Sodium: 136 mEq/L (ref 135–145)
Total Bilirubin: 0.8 mg/dL (ref 0.2–1.2)
Total Protein: 6.9 g/dL (ref 6.0–8.3)

## 2020-06-17 LAB — LIPID PANEL
Cholesterol: 206 mg/dL — ABNORMAL HIGH (ref 0–200)
HDL: 90.4 mg/dL (ref >39.00)
LDL Cholesterol: 105 mg/dL — ABNORMAL HIGH (ref 0–99)
NonHDL: 115.55
Total CHOL/HDL Ratio: 2
Triglycerides: 51 mg/dL (ref 0.0–149.0)
VLDL: 10.2 mg/dL (ref 0.0–40.0)

## 2020-06-17 LAB — TSH: TSH: 2.9 u[IU]/mL (ref 0.35–4.50)

## 2020-06-21 ENCOUNTER — Other Ambulatory Visit: Payer: Self-pay

## 2020-06-21 ENCOUNTER — Encounter: Payer: Self-pay | Admitting: Primary Care

## 2020-06-21 ENCOUNTER — Ambulatory Visit (INDEPENDENT_AMBULATORY_CARE_PROVIDER_SITE_OTHER): Payer: PPO | Admitting: Primary Care

## 2020-06-21 VITALS — BP 148/88 | HR 90 | Temp 97.6°F | Ht 62.5 in | Wt 106.0 lb

## 2020-06-21 DIAGNOSIS — N898 Other specified noninflammatory disorders of vagina: Secondary | ICD-10-CM | POA: Diagnosis not present

## 2020-06-21 DIAGNOSIS — Z23 Encounter for immunization: Secondary | ICD-10-CM | POA: Diagnosis not present

## 2020-06-21 DIAGNOSIS — F419 Anxiety disorder, unspecified: Secondary | ICD-10-CM | POA: Diagnosis not present

## 2020-06-21 DIAGNOSIS — Z1211 Encounter for screening for malignant neoplasm of colon: Secondary | ICD-10-CM | POA: Diagnosis not present

## 2020-06-21 DIAGNOSIS — E039 Hypothyroidism, unspecified: Secondary | ICD-10-CM

## 2020-06-21 DIAGNOSIS — M1990 Unspecified osteoarthritis, unspecified site: Secondary | ICD-10-CM

## 2020-06-21 DIAGNOSIS — Z1231 Encounter for screening mammogram for malignant neoplasm of breast: Secondary | ICD-10-CM | POA: Diagnosis not present

## 2020-06-21 DIAGNOSIS — I10 Essential (primary) hypertension: Secondary | ICD-10-CM

## 2020-06-21 DIAGNOSIS — Z0000 Encounter for general adult medical examination without abnormal findings: Secondary | ICD-10-CM

## 2020-06-21 DIAGNOSIS — G4700 Insomnia, unspecified: Secondary | ICD-10-CM

## 2020-06-21 MED ORDER — LEVOTHYROXINE SODIUM 112 MCG PO TABS: ORAL_TABLET | ORAL | 3 refills | Status: DC

## 2020-06-21 MED ORDER — LOSARTAN POTASSIUM 100 MG PO TABS: ORAL_TABLET | ORAL | 3 refills | Status: DC

## 2020-06-21 MED ORDER — ESTRADIOL 0.1 MG/GM VA CREA: TOPICAL_CREAM | VAGINAL | 1 refills | Status: DC

## 2020-06-21 MED ORDER — ZOSTER VAC RECOMB ADJUVANTED 50 MCG/0.5ML IM SUSR: 0.5000 mL | Freq: Once | INTRAMUSCULAR | 1 refills | Status: AC

## 2020-06-21 MED ORDER — TRAZODONE HCL 150 MG PO TABS: ORAL_TABLET | ORAL | 3 refills | Status: DC

## 2020-06-21 MED ORDER — MELOXICAM 15 MG PO TABS: ORAL_TABLET | ORAL | 3 refills | Status: DC

## 2020-06-21 NOTE — Assessment & Plan Note (Signed)
She is sometimes taking vitamins and Biotin within four hours of levothyroxine. Discussed to separate.  Recent TSH stable. Continue levothyroxine 112 mcg.

## 2020-06-21 NOTE — Assessment & Plan Note (Signed)
Above goal in the office today, does appear anxious.  Recommended she monitor BP at home and notify if readings are not lower.   CMP reviewed. Continue losartan 100 mg.

## 2020-06-21 NOTE — Assessment & Plan Note (Signed)
Chronic, doing well on daily Meloxicam. CMP reviewed.

## 2020-06-21 NOTE — Assessment & Plan Note (Signed)
Immunizations UTD. Rx for Shingrix provided. Mammogram due, ordered. Bone density scan due next year. Colonoscopy due, referral placed to GI.  Discussed the importance of a healthy diet and regular exercise in order for weight loss, and to reduce the risk of any potential medical problems.  Exam today as noted. Labs reviewed.

## 2020-06-21 NOTE — Assessment & Plan Note (Signed)
Doing well on Trazodone HS. Continue same.

## 2020-06-21 NOTE — Assessment & Plan Note (Signed)
Chronic and continued. Evident today. Offered therapy for which she kindly declines. Continue to monitor.

## 2020-06-21 NOTE — Patient Instructions (Signed)
Be sure to take 1200 mg of calcium and at least 800 IU of vitamin D daily.  Continue regular weight bearing exercise.  Continue to work on a healthy diet.   It was a pleasure to see you today!   Preventive Care 44 Years and Older, Female Preventive care refers to lifestyle choices and visits with your health care provider that can promote health and wellness. This includes:  A yearly physical exam. This is also called an annual well check.  Regular dental and eye exams.  Immunizations.  Screening for certain conditions.  Healthy lifestyle choices, such as diet and exercise. What can I expect for my preventive care visit? Physical exam Your health care provider will check:  Height and weight. These may be used to calculate body mass index (BMI), which is a measurement that tells if you are at a healthy weight.  Heart rate and blood pressure.  Your skin for abnormal spots. Counseling Your health care provider may ask you questions about:  Alcohol, tobacco, and drug use.  Emotional well-being.  Home and relationship well-being.  Sexual activity.  Eating habits.  History of falls.  Memory and ability to understand (cognition).  Work and work Statistician.  Pregnancy and menstrual history. What immunizations do I need?  Influenza (flu) vaccine  This is recommended every year. Tetanus, diphtheria, and pertussis (Tdap) vaccine  You may need a Td booster every 10 years. Varicella (chickenpox) vaccine  You may need this vaccine if you have not already been vaccinated. Zoster (shingles) vaccine  You may need this after age 53. Pneumococcal conjugate (PCV13) vaccine  One dose is recommended after age 60. Pneumococcal polysaccharide (PPSV23) vaccine  One dose is recommended after age 49. Measles, mumps, and rubella (MMR) vaccine  You may need at least one dose of MMR if you were born in 1957 or later. You may also need a second dose. Meningococcal conjugate  (MenACWY) vaccine  You may need this if you have certain conditions. Hepatitis A vaccine  You may need this if you have certain conditions or if you travel or work in places where you may be exposed to hepatitis A. Hepatitis B vaccine  You may need this if you have certain conditions or if you travel or work in places where you may be exposed to hepatitis B. Haemophilus influenzae type b (Hib) vaccine  You may need this if you have certain conditions. You may receive vaccines as individual doses or as more than one vaccine together in one shot (combination vaccines). Talk with your health care provider about the risks and benefits of combination vaccines. What tests do I need? Blood tests  Lipid and cholesterol levels. These may be checked every 5 years, or more frequently depending on your overall health.  Hepatitis C test.  Hepatitis B test. Screening  Lung cancer screening. You may have this screening every year starting at age 8 if you have a 30-pack-year history of smoking and currently smoke or have quit within the past 15 years.  Colorectal cancer screening. All adults should have this screening starting at age 71 and continuing until age 81. Your health care provider may recommend screening at age 6 if you are at increased risk. You will have tests every 1-10 years, depending on your results and the type of screening test.  Diabetes screening. This is done by checking your blood sugar (glucose) after you have not eaten for a while (fasting). You may have this done every 1-3 years.  Mammogram. This may be done every 1-2 years. Talk with your health care provider about how often you should have regular mammograms.  BRCA-related cancer screening. This may be done if you have a family history of breast, ovarian, tubal, or peritoneal cancers. Other tests  Sexually transmitted disease (STD) testing.  Bone density scan. This is done to screen for osteoporosis. You may have this  done starting at age 56. Follow these instructions at home: Eating and drinking  Eat a diet that includes fresh fruits and vegetables, whole grains, lean protein, and low-fat dairy products. Limit your intake of foods with high amounts of sugar, saturated fats, and salt.  Take vitamin and mineral supplements as recommended by your health care provider.  Do not drink alcohol if your health care provider tells you not to drink.  If you drink alcohol: ? Limit how much you have to 0-1 drink a day. ? Be aware of how much alcohol is in your drink. In the [place] one drink equals one 12 oz bottle of beer (355 mL), one 5 oz glass of wine (148 mL), or one 1 oz glass of hard liquor (44 mL). Lifestyle  Take daily care of your teeth and gums.  Stay active. Exercise for at least 30 minutes on 5 or more days each week.  Do not use any products that contain nicotine or tobacco, such as cigarettes, e-cigarettes, and chewing tobacco. If you need help quitting, ask your health care provider.  If you are sexually active, practice safe sex. Use a condom or other form of protection in order to prevent STIs (sexually transmitted infections).  Talk with your health care provider about taking a low-dose aspirin or statin. What's next?  Go to your health care provider once a year for a well check visit.  Ask your health care provider how often you should have your eyes and teeth checked.  Stay up to date on all vaccines. This information is not intended to replace advice given to you by your health care provider. Make sure you discuss any questions you have with your health care provider. Document Revised: 07/28/2018 Document Reviewed: 07/28/2018 Elsevier Patient Education  2020 [employer].

## 2020-06-21 NOTE — Progress Notes (Signed)
Subjective:    Patient ID: Sandra [name], female    DOB: [birth year], 69 y.o.   MRN: [id number]  HPI  This visit occurred during the SARS-CoV-2 public health emergency.  Safety protocols were in place, including screening questions prior to the visit, additional usage of staff PPE, and extensive cleaning of exam room while observing appropriate contact time as indicated for disinfecting solutions.   Sandra [name] is a 69 year old female who presents today for complete physical.  Immunizations: -Tetanus: Completed in 2017 -Influenza: Completed this season  -Shingles: Never completed  -Pneumonia: Completed series -Covid-19: Never completed   Diet: She endorses a healthy diet.  Exercise: Exercises four days weekly, is active daily.  Eye exam: Completes annually  Dental exam: Completes semi-annually   Mammogram: Completed in 2020 Dexa: Completed in 2020, she is compliant to calcium and vitamin.  Colonoscopy: Completed in 2011, due. Hep C Screen: Negative  BP Readings from Last 3 Encounters:  06/21/20 (!) 148/88  08/09/19 (!) 163/99  05/24/19 124/72   She is not checking her BP at home. She is compliant to her losartan 100 mg daily. She denies chest pain, dizziness, headaches. She carries a lot of stress and worry with her daily regarding her family. She does not see a therapist, but feels like she has a good support system.   The 10-year ASCVD risk score Mikey Bussing DC Brooke Bonito., et al., 2013) is: 11.9%   Values used to calculate the score:     Age: 19 years     Sex: Female     Is Non-Hispanic African American: No     Diabetic: No     Tobacco smoker: No     Systolic Blood Pressure: 659 mmHg     Is BP treated: Yes     HDL Cholesterol: 90.4 mg/dL     Total Cholesterol: 206 mg/dL   Review of Systems  Constitutional: Negative for unexpected weight change.  HENT: Negative for rhinorrhea.   Respiratory: Negative for cough and shortness of breath.   Cardiovascular: Negative for chest  pain.  Gastrointestinal: Negative for constipation and diarrhea.  Genitourinary: Negative for difficulty urinating.  Musculoskeletal: Positive for arthralgias.  Skin: Negative for rash.  Allergic/Immunologic: Negative for environmental allergies.  Neurological: Negative for dizziness and headaches.  Psychiatric/Behavioral: Negative for sleep disturbance. The patient is nervous/anxious.        Past Medical History:  Diagnosis Date  . Hypertension   . Thyroid disease      Social History   Socioeconomic History  . Marital status: Married    Spouse name: Not on file  . Number of children: Not on file  . Years of education: Not on file  . Highest education level: Not on file  Occupational History  . Not on file  Tobacco Use  . Smoking status: Never Smoker  . Smokeless tobacco: Never Used  Vaping Use  . Vaping Use: Never used  Substance and Sexual Activity  . Alcohol use: Yes  . Drug use: No  . Sexual activity: Not on file  Other Topics Concern  . Not on file  Social History Narrative   Married.   2 children, no grandchildren.   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    Has a living will   Social Determinants of Health   Financial Resource Strain:   .  Difficulty of Paying Living Expenses: Not on file  Food Insecurity:   . Worried About Charity fundraiser in the Last Year: Not on file  . Ran Out of Food in the Last Year: Not on file  Transportation Needs:   . Lack of Transportation (Medical): Not on file  . Lack of Transportation (Non-Medical): Not on file  Physical Activity:   . Days of Exercise per Week: Not on file  . Minutes of Exercise per Session: Not on file  Stress:   . Feeling of Stress : Not on file  Social Connections:   . Frequency of Communication with Friends and Family: Not on file  . Frequency of Social Gatherings with Friends and  Family: Not on file  . Attends Religious Services: Not on file  . Active Member of Clubs or Organizations: Not on file  . Attends Archivist Meetings: Not on file  . Marital Status: Not on file  Intimate Partner Violence:   . Fear of Current or Ex-Partner: Not on file  . Emotionally Abused: Not on file  . Physically Abused: Not on file  . Sexually Abused: Not on file    Past Surgical History:  Procedure Laterality Date  . CESAREAN SECTION     X 2    Family History  Problem Relation Age of Onset  . Arthritis Mother        s/p hip replacement  . Hyperlipidemia Mother   . Heart disease Mother   . Hypertension Mother   . Arthritis Father   . Hyperlipidemia Father   . Heart disease Father   . Hypertension Father   . Dementia Father   . Thrombocytopenia Son   . Hearing loss Son   . Thrombocytopenia Son     No Known Allergies  Current Outpatient Medications on File Prior to Visit  Medication Sig Dispense Refill  . BIOTIN 5000 PO Take one by mouth daily    . calcium-vitamin D (OSCAL WITH D) 500-200 MG-UNIT tablet Take 2 tablets by mouth daily.    . fluticasone (FLONASE) 50 MCG/ACT nasal spray USE ONE TO TWO SPRAYS IN EACH NOSTRIL EVERYDAY IF NEEDED 16 g 3  . Melatonin 5 MG TABS Take 5 mg by mouth at bedtime as needed.      No current facility-administered medications on file prior to visit.    BP (!) 148/88   Pulse 90   Temp 97.6 F (36.4 C) (Temporal)   Ht 5' 2.5" (1.588 m)   Wt 106 lb (48.1 kg)   SpO2 99%   BMI 19.08 kg/m    Objective:   Physical Exam HENT:     Right Ear: Tympanic membrane and ear canal normal.     Left Ear: Tympanic membrane and ear canal normal.  Eyes:     Pupils: Pupils are equal, round, and reactive to light.  Cardiovascular:     Rate and Rhythm: Normal rate and regular rhythm.  Pulmonary:     Effort: Pulmonary effort is normal.     Breath sounds: Normal breath sounds.  Abdominal:     General: Bowel sounds are normal.      Palpations: Abdomen is soft.     Tenderness: There is no abdominal tenderness.  Musculoskeletal:        General: Normal range of motion.     Cervical back: Neck supple.  Skin:    General: Skin is warm and dry.  Neurological:     Mental Status: She is  alert and oriented to person, place, and time.     Cranial Nerves: No cranial nerve deficit.     Deep Tendon Reflexes:     Reflex Scores:      Patellar reflexes are 2+ on the right side and 2+ on the left side. Psychiatric:        Mood and Affect: Mood normal.     Comments: Tearful at times during visit            Assessment & Plan:

## 2020-06-21 NOTE — Assessment & Plan Note (Signed)
Improved with Estrace for which she uses several times weekly. Refills provided.

## 2020-06-24 DIAGNOSIS — L659 Nonscarring hair loss, unspecified: Secondary | ICD-10-CM

## 2020-07-01 ENCOUNTER — Other Ambulatory Visit: Payer: Self-pay

## 2020-07-01 ENCOUNTER — Telehealth (INDEPENDENT_AMBULATORY_CARE_PROVIDER_SITE_OTHER): Payer: Self-pay | Admitting: Gastroenterology

## 2020-07-01 DIAGNOSIS — Z1211 Encounter for screening for malignant neoplasm of colon: Secondary | ICD-10-CM

## 2020-07-01 DIAGNOSIS — R682 Dry mouth, unspecified: Secondary | ICD-10-CM | POA: Insufficient documentation

## 2020-07-01 MED ORDER — NA SULFATE-K SULFATE-MG SULF 17.5-3.13-1.6 GM/177ML PO SOLN
1.0000 | Freq: Once | ORAL | 0 refills | Status: AC
Start: 2020-07-01 — End: 2020-07-01

## 2020-07-01 NOTE — Progress Notes (Signed)
Gastroenterology Pre-Procedure Review  Request Date: 07/23/20 Requesting Physician: Dr. Bonna Gains  PATIENT REVIEW QUESTIONS: The patient responded to the following health history questions as indicated:    Patient states she experiences nausea after anesthesia is administered.  She has requested nausea medication to be administered prior to colonoscopy in IV.  1. Are you having any GI issues? no 2. Do you have a personal history of Polyps? no 3. Do you have a family history of Colon Cancer or Polyps? no 4. Diabetes Mellitus? no 5. Joint replacements in the past 12 months?no 6. Major health problems in the past 3 months?no 7. Any artificial heart valves, MVP, or defibrillator?no    MEDICATIONS & ALLERGIES:    Patient reports the following regarding taking any anticoagulation/antiplatelet therapy:   Plavix, Coumadin, Eliquis, Xarelto, Lovenox, Pradaxa, Brilinta, or Effient? no Aspirin? no  Patient confirms/reports the following medications:  Current Outpatient Medications  Medication Sig Dispense Refill  . BIOTIN 5000 PO Take one by mouth daily    . calcium-vitamin D (OSCAL WITH D) 500-200 MG-UNIT tablet Take 2 tablets by mouth daily.    Marland Kitchen estradiol (ESTRACE) 0.1 MG/GM vaginal cream INSERT 1 GRAM 1 TO 3 TIMES WEEKLY 42.5 g 1  . fluticasone (FLONASE) 50 MCG/ACT nasal spray USE ONE TO TWO SPRAYS IN EACH NOSTRIL EVERYDAY IF NEEDED 16 g 3  . levothyroxine (SYNTHROID) 112 MCG tablet TAKE 1 TABLET EVERY DAY ON EMPTY STOMACHWITH A GLASS OF WATER AT LEAST 30-60 MINBEFORE BREAKFAST 90 tablet 3  . losartan (COZAAR) 100 MG tablet TAKE ONE TABLET by mouth EVERY DAY for blood pressure. 90 tablet 3  . Melatonin 5 MG TABS Take 5 mg by mouth at bedtime as needed.     . meloxicam (MOBIC) 15 MG tablet TAKE ONE TABLET EVERY DAY AS NEEDED FOR PAIN. TAKE WITH FOOD 90 tablet 3  . traZODone (DESYREL) 150 MG tablet TAKE ONE TABLET AT BEDTIME AS NEEDED FORINSOMNIA 90 tablet 3  . Na Sulfate-K Sulfate-Mg Sulf  17.5-3.13-1.6 GM/177ML SOLN Take 1 kit by mouth once for 1 dose. 354 mL 0   No current facility-administered medications for this visit.    Patient confirms/reports the following allergies:  No Known Allergies  No orders of the defined types were placed in this encounter.   AUTHORIZATION INFORMATION Primary Insurance: 1D#: Group #:  Secondary Insurance: 1D#: Group #:  SCHEDULE INFORMATION: Date: Tuesday 07/23/20 Time: Location:[facility]

## 2020-07-17 ENCOUNTER — Telehealth: Payer: Self-pay

## 2020-07-17 NOTE — Telephone Encounter (Signed)
Returned patients call. Pt had questions regarding medications before procedure. Questions answered. Pt verbalized understanding.

## 2020-07-19 ENCOUNTER — Other Ambulatory Visit
Admission: RE | Admit: 2020-07-19 | Discharge: 2020-07-19 | Disposition: A | Discharge status: Home / Self Care | Payer: PPO | Source: Ambulatory Visit | Attending: Gastroenterology | Admitting: Gastroenterology

## 2020-07-19 ENCOUNTER — Other Ambulatory Visit: Payer: Self-pay

## 2020-07-19 DIAGNOSIS — Z20822 Contact with and (suspected) exposure to covid-19: Secondary | ICD-10-CM | POA: Insufficient documentation

## 2020-07-19 DIAGNOSIS — Z01812 Encounter for preprocedural laboratory examination: Secondary | ICD-10-CM | POA: Insufficient documentation

## 2020-07-20 LAB — SARS CORONAVIRUS 2 (TAT 6-24 HRS): SARS Coronavirus 2: NEGATIVE

## 2020-07-23 ENCOUNTER — Encounter: Payer: Self-pay | Admitting: Gastroenterology

## 2020-07-23 ENCOUNTER — Ambulatory Visit: Payer: PPO | Admitting: Certified Registered Nurse Anesthetist

## 2020-07-23 ENCOUNTER — Ambulatory Visit
Admission: RE | Admit: 2020-07-23 | Discharge: 2020-07-23 | Disposition: A | Discharge status: Home / Self Care | Payer: PPO | Attending: Gastroenterology | Admitting: Gastroenterology

## 2020-07-23 ENCOUNTER — Other Ambulatory Visit: Payer: Self-pay

## 2020-07-23 ENCOUNTER — Encounter
Admission: RE | Disposition: A | Discharge status: Home / Self Care | Payer: Self-pay | Source: Home / Self Care | Attending: Gastroenterology

## 2020-07-23 DIAGNOSIS — Z1211 Encounter for screening for malignant neoplasm of colon: Secondary | ICD-10-CM

## 2020-07-23 DIAGNOSIS — Z86010 Personal history of colonic polyps: Secondary | ICD-10-CM | POA: Diagnosis not present

## 2020-07-23 DIAGNOSIS — Z79899 Other long term (current) drug therapy: Secondary | ICD-10-CM | POA: Insufficient documentation

## 2020-07-23 DIAGNOSIS — Z79890 Hormone replacement therapy (postmenopausal): Secondary | ICD-10-CM | POA: Insufficient documentation

## 2020-07-23 HISTORY — PX: COLONOSCOPY WITH PROPOFOL: SHX5780

## 2020-07-23 SURGERY — COLONOSCOPY WITH PROPOFOL
Anesthesia: General

## 2020-07-23 MED ORDER — GLYCOPYRROLATE 0.2 MG/ML IJ SOLN
INTRAMUSCULAR | Status: AC
  Filled 2020-07-23: qty 1

## 2020-07-23 MED ORDER — SODIUM CHLORIDE 0.9 % IV SOLN: INTRAVENOUS | Status: DC

## 2020-07-23 MED ORDER — PROPOFOL 500 MG/50ML IV EMUL
INTRAVENOUS | Status: AC
  Filled 2020-07-23: qty 50

## 2020-07-23 MED ORDER — PROPOFOL 10 MG/ML IV BOLUS
INTRAVENOUS | Status: DC | PRN
  Administered 2020-07-23 (×2): 30 mg via INTRAVENOUS
  Administered 2020-07-23: 20 mg via INTRAVENOUS
  Administered 2020-07-23: 50 mg via INTRAVENOUS

## 2020-07-23 MED ORDER — LIDOCAINE HCL (PF) 2 % IJ SOLN
INTRAMUSCULAR | Status: AC
  Filled 2020-07-23: qty 5

## 2020-07-23 MED ORDER — ONDANSETRON HCL 4 MG/2ML IJ SOLN
INTRAMUSCULAR | Status: AC
  Filled 2020-07-23: qty 2

## 2020-07-23 MED ORDER — PHENYLEPHRINE HCL (PRESSORS) 10 MG/ML IV SOLN
INTRAVENOUS | Status: AC
  Filled 2020-07-23: qty 1

## 2020-07-23 MED ORDER — ONDANSETRON HCL 4 MG/2ML IJ SOLN
INTRAMUSCULAR | Status: DC | PRN
  Administered 2020-07-23: 4 mg via INTRAVENOUS

## 2020-07-23 MED ORDER — LIDOCAINE HCL (CARDIAC) PF 100 MG/5ML IV SOSY
PREFILLED_SYRINGE | INTRAVENOUS | Status: DC | PRN
  Administered 2020-07-23: 40 mg via INTRAVENOUS

## 2020-07-23 MED ORDER — PROPOFOL 500 MG/50ML IV EMUL
INTRAVENOUS | Status: DC | PRN
  Administered 2020-07-23: 150 ug/kg/min via INTRAVENOUS

## 2020-07-23 NOTE — Op Note (Signed)
[facility] Patient Name: Sandra [name] Procedure Date: 07/23/2020 8:42 AM MRN: [id number] Account #: [id number] Date of Birth: 12-18-1950 Admit Type: Outpatient Age: 69 Room: [facility] ENDO ROOM 3 Gender: Female Note Status: Finalized Procedure:             Colonoscopy Indications:           High risk colon cancer surveillance: Personal history                         of colonic polyps Providers:             Dilia Alemany B. Bonna Gains MD, MD Referring MD:          Pleas Koch (Referring MD) Medicines:             Monitored Anesthesia Care Complications:         No immediate complications. Procedure:             Pre-Anesthesia Assessment:                        - Prior to the procedure, a History and Physical was                         performed, and patient medications, allergies and                         sensitivities were reviewed. The patient's tolerance                         of previous anesthesia was reviewed.                        - The risks and benefits of the procedure and the                         sedation options and risks were discussed with the                         patient. All questions were answered and informed                         consent was obtained.                        - Patient identification and proposed procedure were                         verified prior to the procedure by the physician, the                         nurse, the anesthetist and the technician. The                         procedure was verified in the pre-procedure area in                         the procedure room in the endoscopy suite.                        -  ASA Grade Assessment: II - A patient with mild                         systemic disease.                        - After reviewing the risks and benefits, the patient                         was deemed in satisfactory condition to undergo the                         procedure.                         After obtaining informed consent, the colonoscope was                         passed under direct vision. Throughout the procedure,                         the patient's blood pressure, pulse, and oxygen                         saturations were monitored continuously. The                         Colonoscope was introduced through the anus and                         advanced to the the cecum, identified by appendiceal                         orifice and ileocecal valve. The colonoscopy was                         performed with ease. The patient tolerated the                         procedure well. The quality of the bowel preparation                         was good. Findings:      The perianal and digital rectal examinations were normal.      The rectum, sigmoid colon, descending colon, transverse colon, ascending       colon and cecum appeared normal.      Anal papilla(e) were hypertrophied.      No additional abnormalities were found on retroflexion. Impression:            - The rectum, sigmoid colon, descending colon,                         transverse colon, ascending colon and cecum are normal.                        - Anal papilla(e) were hypertrophied.                        - No specimens collected. Recommendation:        -  Discharge patient to home.                        - Resume previous diet.                        - Continue present medications.                        - Repeat colonoscopy in 5 years.                        - Return to primary care physician as previously                         scheduled.                        - The findings and recommendations were discussed with                         the patient.                        - The findings and recommendations were discussed with                         the patient's family. Procedure Code(s):     --- Professional ---                        [phone], Colonoscopy, flexible; diagnostic,  including                         collection of specimen(s) by brushing or washing, when                         performed (separate procedure) Diagnosis Code(s):     --- Professional ---                        Z86.010, Personal history of colonic polyps CPT copyright 2019 American Medical Association. All rights reserved. The codes documented in this report are preliminary and upon coder review may  be revised to meet current compliance requirements.  Vonda Antigua, MD Margretta Sidle B. Bonna Gains MD, MD 07/23/2020 9:30:54 AM This report has been signed electronically. Number of Addenda: 0 Note Initiated On: 07/23/2020 8:42 AM Scope Withdrawal Time: 0 hours 16 minutes 2 seconds  Total Procedure Duration: 0 hours 36 minutes 40 seconds       [facility]

## 2020-07-23 NOTE — Anesthesia Postprocedure Evaluation (Signed)
Anesthesia Post Note  Patient: Sandra [name]  Procedure(s) Performed: COLONOSCOPY WITH PROPOFOL (N/A )  Patient location during evaluation: Endoscopy Anesthesia Type: General Level of consciousness: awake and alert Pain management: pain level controlled Vital Signs Assessment: post-procedure vital signs reviewed and stable Respiratory status: spontaneous breathing, nonlabored ventilation, respiratory function stable and patient connected to nasal cannula oxygen Cardiovascular status: blood pressure returned to baseline and stable Postop Assessment: no apparent nausea or vomiting Anesthetic complications: no   No complications documented.   Last Vitals:  Vitals:   07/23/20 0812 07/23/20 0927  BP: (!) 179/95 119/78  Pulse: 81   Resp: 16   Temp: (!) 36.4 C (!) 36.2 C  SpO2: 100%     Last Pain:  Vitals:   07/23/20 0947  TempSrc:   PainSc: 0-No pain                 Precious Haws Edger Husain

## 2020-07-23 NOTE — H&P (Addendum)
Sandra Antigua, MD [address], [place], [place], [place], [address] [address], [place], [place], [place], [address] Phone: [phone]  Fax: [phone]  Primary Care Physician:  Pleas Koch, NP   Pre-Procedure History & Physical: HPI:  Sandra [name] is a 69 y.o. female is here for a colonoscopy.   Past Medical History:  Diagnosis Date  . Hypertension   . Thyroid disease     Past Surgical History:  Procedure Laterality Date  . CESAREAN SECTION     X 2    Prior to Admission medications   Medication Sig Start Date End Date Taking? Authorizing Provider  BIOTIN 5000 PO Take one by mouth daily   Yes [provider]  calcium-vitamin D (OSCAL WITH D) 500-200 MG-UNIT tablet Take 2 tablets by mouth daily.   Yes [provider]  fluticasone (FLONASE) 50 MCG/ACT nasal spray USE ONE TO TWO SPRAYS IN EACH NOSTRIL EVERYDAY IF NEEDED 09/24/14  Yes Lucille Passy, MD  levothyroxine (SYNTHROID) 112 MCG tablet TAKE 1 TABLET EVERY DAY ON EMPTY STOMACHWITH A GLASS OF WATER AT LEAST 30-60 MINBEFORE BREAKFAST 06/21/20  Yes Pleas Koch, NP  losartan (COZAAR) 100 MG tablet TAKE ONE TABLET by mouth EVERY DAY for blood pressure. 06/21/20  Yes Pleas Koch, NP  Melatonin 5 MG TABS Take 5 mg by mouth at bedtime as needed.    Yes [provider]  meloxicam (MOBIC) 15 MG tablet TAKE ONE TABLET EVERY DAY AS NEEDED FOR PAIN. TAKE WITH FOOD 06/21/20  Yes Pleas Koch, NP  traZODone (DESYREL) 150 MG tablet TAKE ONE TABLET AT BEDTIME AS NEEDED FORINSOMNIA 06/21/20  Yes Pleas Koch, NP  estradiol (ESTRACE) 0.1 MG/GM vaginal cream INSERT 1 GRAM 1 TO 3 TIMES WEEKLY 06/21/20   Pleas Koch, NP    Allergies as of 07/01/2020  . (No Known Allergies)    Family History  Problem Relation Age of Onset  . Arthritis Mother        s/p hip replacement  . Hyperlipidemia Mother   . Heart disease Mother   . Hypertension Mother   . Arthritis  Father   . Hyperlipidemia Father   . Heart disease Father   . Hypertension Father   . Dementia Father   . Thrombocytopenia Son   . Hearing loss Son   . Thrombocytopenia Son     Social History   Socioeconomic History  . Marital status: Married    Spouse name: Not on file  . Number of children: Not on file  . Years of education: Not on file  . Highest education level: Not on file  Occupational History  . Not on file  Tobacco Use  . Smoking status: Never Smoker  . Smokeless tobacco: Never Used  Vaping Use  . Vaping Use: Never used  Substance and Sexual Activity  . Alcohol use: Yes  . Drug use: No  . Sexual activity: Not on file  Other Topics Concern  . Not on file  Social History Narrative   Married.   2 children, no grandchildren.   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    Has a living will   Social Determinants of Health   Financial Resource Strain:   . Difficulty of Paying Living Expenses: Not on file  Food Insecurity:   . Worried  About Running Out of Food in the Last Year: Not on file  . Ran Out of Food in the Last Year: Not on file  Transportation Needs:   . Lack of Transportation (Medical): Not on file  . Lack of Transportation (Non-Medical): Not on file  Physical Activity:   . Days of Exercise per Week: Not on file  . Minutes of Exercise per Session: Not on file  Stress:   . Feeling of Stress : Not on file  Social Connections:   . Frequency of Communication with Friends and Family: Not on file  . Frequency of Social Gatherings with Friends and Family: Not on file  . Attends Religious Services: Not on file  . Active Member of Clubs or Organizations: Not on file  . Attends Archivist Meetings: Not on file  . Marital Status: Not on file  Intimate Partner Violence:   . Fear of Current or Ex-Partner: Not on file  . Emotionally  Abused: Not on file  . Physically Abused: Not on file  . Sexually Abused: Not on file    Review of Systems: See HPI, otherwise negative ROS  Physical Exam: BP (!) 179/95   Pulse 81   Temp (!) 97.5 F (36.4 C) (Temporal)   Resp 16   Ht 5\' 3"  (1.6 m)   Wt 47.6 kg   SpO2 100%   BMI 18.60 kg/m  General:   Alert,  pleasant and cooperative in NAD Head:  Normocephalic and atraumatic. Neck:  Supple; no masses or thyromegaly. Lungs:  Clear throughout to auscultation, normal respiratory effort.    Heart:  +S1, +S2, Regular rate and rhythm, No edema. Abdomen:  Soft, nontender and nondistended. Normal bowel sounds, without guarding, and without rebound.   Neurologic:  Alert and  oriented x4;  grossly normal neurologically.  Impression/Plan: Sandra [name] is here for a colonoscopy to be performed for history of polyps. Pt describes having polyps on a previous colonoscopy. That procedure or pathology report is not available. But 2011 colonoscopy by Dr. Vira Agar states history of polyps as the indication for 2011 colonoscopy as well.   Risks, benefits, limitations, and alternatives regarding  colonoscopy have been reviewed with the patient.  Questions have been answered.  All parties agreeable.   Virgel Manifold, MD  07/23/2020, 8:41 AM

## 2020-07-23 NOTE — Transfer of Care (Signed)
Immediate Anesthesia Transfer of Care Note  Patient: Sandra [name]  Procedure(s) Performed: COLONOSCOPY WITH PROPOFOL (N/A )  Patient Location: Endoscopy Unit  Anesthesia Type:General  Level of Consciousness: awake, alert  and oriented  Airway & Oxygen Therapy: Patient Spontanous Breathing  Post-op Assessment: Report given to RN and Post -op Vital signs reviewed and stable  Post vital signs: Reviewed and stable  Last Vitals:  Vitals Value Taken Time  BP 119/78 07/23/20 0927  Temp    Pulse 63 07/23/20 0928  Resp 13 07/23/20 0928  SpO2 98 % 07/23/20 0928  Vitals shown include unvalidated device data.  Last Pain:  Vitals:   07/23/20 0812  TempSrc: Temporal  PainSc: 0-No pain         Complications: No complications documented.

## 2020-07-23 NOTE — Anesthesia Preprocedure Evaluation (Signed)
Anesthesia Evaluation  Patient identified by MRN, date of birth, ID band Patient awake    Reviewed: Allergy & Precautions, H&P , NPO status , Patient's Chart, lab work & pertinent test results  History of Anesthesia Complications (+) PONV and history of anesthetic complications  Airway Mallampati: III  TM Distance: <3 FB Neck ROM: limited    Dental  (+) Chipped   Pulmonary neg pulmonary ROS, neg shortness of breath,    Pulmonary exam normal        Cardiovascular Exercise Tolerance: Good hypertension, Normal cardiovascular exam     Neuro/Psych PSYCHIATRIC DISORDERS negative neurological ROS     GI/Hepatic negative GI ROS, Neg liver ROS,   Endo/Other  Hypothyroidism   Renal/GU negative Renal ROS  negative genitourinary   Musculoskeletal  (+) Arthritis ,   Abdominal   Peds  Hematology negative hematology ROS (+)   Anesthesia Other Findings Past Medical History: No date: Hypertension No date: Thyroid disease  Past Surgical History: No date: CESAREAN SECTION     Comment:  X 2  BMI    Body Mass Index: 18.60 kg/m      Reproductive/Obstetrics negative OB ROS                             Anesthesia Physical Anesthesia Plan  ASA: II  Anesthesia Plan: General   Post-op Pain Management:    Induction: Intravenous  PONV Risk Score and Plan: Propofol infusion and TIVA  Airway Management Planned: Natural Airway and Nasal Cannula  Additional Equipment:   Intra-op Plan:   Post-operative Plan:   Informed Consent: I have reviewed the patients History and Physical, chart, labs and discussed the procedure including the risks, benefits and alternatives for the proposed anesthesia with the patient or authorized representative who has indicated his/her understanding and acceptance.     Dental Advisory Given  Plan Discussed with: Anesthesiologist, CRNA and Surgeon  Anesthesia Plan  Comments: (Patient consented for risks of anesthesia including but not limited to:  - adverse reactions to medications - risk of airway placement if required - damage to eyes, teeth, lips or other oral mucosa - nerve damage due to positioning  - sore throat or hoarseness - Damage to heart, brain, nerves, lungs, other parts of body or loss of life  Patient voiced understanding.)        Anesthesia Quick Evaluation

## 2020-07-24 ENCOUNTER — Encounter: Payer: Self-pay | Admitting: Gastroenterology

## 2020-08-22 DIAGNOSIS — L659 Nonscarring hair loss, unspecified: Secondary | ICD-10-CM | POA: Diagnosis not present

## 2020-08-22 DIAGNOSIS — L658 Other specified nonscarring hair loss: Secondary | ICD-10-CM | POA: Diagnosis not present

## 2020-08-25 DIAGNOSIS — I10 Essential (primary) hypertension: Secondary | ICD-10-CM

## 2020-09-09 ENCOUNTER — Other Ambulatory Visit: Payer: Self-pay

## 2020-09-09 ENCOUNTER — Other Ambulatory Visit (INDEPENDENT_AMBULATORY_CARE_PROVIDER_SITE_OTHER): Payer: PPO

## 2020-09-09 DIAGNOSIS — I10 Essential (primary) hypertension: Secondary | ICD-10-CM | POA: Diagnosis not present

## 2020-09-09 LAB — BASIC METABOLIC PANEL
BUN: 20 mg/dL (ref 6–23)
CO2: 27 mEq/L (ref 19–32)
Calcium: 9.3 mg/dL (ref 8.4–10.5)
Chloride: 96 mEq/L (ref 96–112)
Creatinine, Ser: 0.68 mg/dL (ref 0.40–1.20)
GFR: 89.53 mL/min (ref >60.00)
Glucose, Bld: 99 mg/dL (ref 70–99)
Potassium: 4.2 mEq/L (ref 3.5–5.1)
Sodium: 130 mEq/L — ABNORMAL LOW (ref 135–145)

## 2020-09-18 DIAGNOSIS — E871 Hypo-osmolality and hyponatremia: Secondary | ICD-10-CM

## 2020-09-27 DIAGNOSIS — D2271 Melanocytic nevi of right lower limb, including hip: Secondary | ICD-10-CM | POA: Diagnosis not present

## 2020-09-27 DIAGNOSIS — D225 Melanocytic nevi of trunk: Secondary | ICD-10-CM | POA: Diagnosis not present

## 2020-09-27 DIAGNOSIS — D2261 Melanocytic nevi of right upper limb, including shoulder: Secondary | ICD-10-CM | POA: Diagnosis not present

## 2020-09-27 DIAGNOSIS — D2272 Melanocytic nevi of left lower limb, including hip: Secondary | ICD-10-CM | POA: Diagnosis not present

## 2020-09-27 DIAGNOSIS — Z85828 Personal history of other malignant neoplasm of skin: Secondary | ICD-10-CM | POA: Diagnosis not present

## 2020-09-27 DIAGNOSIS — D2262 Melanocytic nevi of left upper limb, including shoulder: Secondary | ICD-10-CM | POA: Diagnosis not present

## 2020-09-27 DIAGNOSIS — L821 Other seborrheic keratosis: Secondary | ICD-10-CM | POA: Diagnosis not present

## 2020-09-30 ENCOUNTER — Other Ambulatory Visit (INDEPENDENT_AMBULATORY_CARE_PROVIDER_SITE_OTHER): Payer: PPO

## 2020-09-30 ENCOUNTER — Other Ambulatory Visit: Payer: Self-pay

## 2020-09-30 DIAGNOSIS — E871 Hypo-osmolality and hyponatremia: Secondary | ICD-10-CM | POA: Diagnosis not present

## 2020-09-30 LAB — BASIC METABOLIC PANEL
BUN: 18 mg/dL (ref 6–23)
CO2: 28 mEq/L (ref 19–32)
Calcium: 9.8 mg/dL (ref 8.4–10.5)
Chloride: 94 mEq/L — ABNORMAL LOW (ref 96–112)
Creatinine, Ser: 0.72 mg/dL (ref 0.40–1.20)
GFR: 85.91 mL/min (ref >60.00)
Glucose, Bld: 130 mg/dL — ABNORMAL HIGH (ref 70–99)
Potassium: 4.1 mEq/L (ref 3.5–5.1)
Sodium: 131 mEq/L — ABNORMAL LOW (ref 135–145)

## 2020-10-04 DIAGNOSIS — Z1231 Encounter for screening mammogram for malignant neoplasm of breast: Secondary | ICD-10-CM | POA: Diagnosis not present

## 2020-10-04 LAB — HM MAMMOGRAPHY

## 2020-10-07 ENCOUNTER — Encounter: Payer: Self-pay | Admitting: Primary Care

## 2020-12-19 DIAGNOSIS — M25562 Pain in left knee: Secondary | ICD-10-CM | POA: Diagnosis not present

## 2020-12-19 DIAGNOSIS — M25561 Pain in right knee: Secondary | ICD-10-CM | POA: Diagnosis not present

## 2021-04-11 ENCOUNTER — Telehealth: Payer: Self-pay | Admitting: Primary Care

## 2021-04-11 DIAGNOSIS — L658 Other specified nonscarring hair loss: Secondary | ICD-10-CM | POA: Diagnosis not present

## 2021-04-11 NOTE — Telephone Encounter (Signed)
Spoke to Dermatology wanted to try Minoxidil 1.'25mg'$  for her hair loss but wanted to check and see if that would interact with any HTN treatment that you are giving.

## 2021-04-11 NOTE — Telephone Encounter (Signed)
Mrs. Robina Ade called in had questions about Mrs. [employer]

## 2021-04-11 NOTE — Telephone Encounter (Addendum)
There is no interaction between losartan and the minoxidil according to [employer].

## 2021-04-14 NOTE — Telephone Encounter (Signed)
Called patient let know. She will give [place] a call if any questions/issues. Thinks her BP has been doing better will keep log of readings and bring to visit to see if she can come off bp med.

## 2021-06-05 ENCOUNTER — Telehealth: Payer: Self-pay | Admitting: Primary Care

## 2021-06-05 NOTE — Telephone Encounter (Signed)
Patient will need CPE, okay to schedule for early January 2022. We will refill meds to get her through. Let me know when scheduled. Also, have her pharmacy notify us when she needs refills.

## 2021-06-05 NOTE — Telephone Encounter (Signed)
Pt called about her CPE coming up on 06/24/21 and said that she really doesn't want to drive all the way to [place] and would rather wait until she can go to the [facility] office again, but she does want to make sure she can continue getting her medication refilled and said she can switch that appt to a virtual if needed and if she needed labs done she can do them at the [place] office. Please advise and let pt know what she needs to do

## 2021-06-06 NOTE — Telephone Encounter (Signed)
Appointment moved and patient informed to call pharmacy when refills are needed no further action needed at this time.

## 2021-06-24 ENCOUNTER — Encounter: Payer: PPO | Admitting: Primary Care

## 2021-06-30 ENCOUNTER — Other Ambulatory Visit: Payer: Self-pay | Admitting: Primary Care

## 2021-06-30 DIAGNOSIS — I10 Essential (primary) hypertension: Secondary | ICD-10-CM

## 2021-07-21 DIAGNOSIS — M170 Bilateral primary osteoarthritis of knee: Secondary | ICD-10-CM | POA: Diagnosis not present

## 2021-08-12 ENCOUNTER — Other Ambulatory Visit: Payer: Self-pay | Admitting: Primary Care

## 2021-08-12 DIAGNOSIS — G4700 Insomnia, unspecified: Secondary | ICD-10-CM

## 2021-08-20 ENCOUNTER — Other Ambulatory Visit: Payer: Self-pay | Admitting: Primary Care

## 2021-08-20 DIAGNOSIS — M1990 Unspecified osteoarthritis, unspecified site: Secondary | ICD-10-CM

## 2021-08-26 NOTE — Telephone Encounter (Signed)
FYI

## 2021-08-27 ENCOUNTER — Ambulatory Visit: Payer: PPO | Admitting: Primary Care

## 2021-09-02 ENCOUNTER — Other Ambulatory Visit: Payer: Self-pay

## 2021-09-02 ENCOUNTER — Telehealth (INDEPENDENT_AMBULATORY_CARE_PROVIDER_SITE_OTHER): Payer: PPO | Admitting: Primary Care

## 2021-09-02 ENCOUNTER — Encounter: Payer: Self-pay | Admitting: Primary Care

## 2021-09-02 ENCOUNTER — Ambulatory Visit: Payer: PPO

## 2021-09-02 DIAGNOSIS — F411 Generalized anxiety disorder: Secondary | ICD-10-CM | POA: Diagnosis not present

## 2021-09-02 MED ORDER — HYDROXYZINE HCL 10 MG PO TABS: 10.0000 mg | ORAL_TABLET | Freq: Two times a day (BID) | ORAL | 0 refills | Status: DC | PRN

## 2021-09-02 NOTE — Patient Instructions (Signed)
You may take hydroxyzine 10 mg once or twice daily as needed for anxiety. This may cause drowsiness.   Please update me if anything changes!  See you next month!

## 2021-09-02 NOTE — Assessment & Plan Note (Signed)
Chronic, increased symptoms.  Discussed options including hydroxyzine for PRN use and/or SSRI. She opts for hydroxyzine to start.  Rx for hydroxyzine 10 mg sent to pharmacy. We discussed that she can take this up to twice daily, and also that it may cause drowsiness.   We will see her for follow up in 1 month.

## 2021-09-02 NOTE — Progress Notes (Signed)
Patient ID: Sandra [name], female    DOB: [birth year], 71 y.o.   MRN: [id number]  Virtual visit completed through [place], a video enabled telemedicine application. Due to national recommendations of social distancing due to COVID-19, a virtual visit is felt to be most appropriate for this patient at this time. Reviewed limitations, risks, security and privacy concerns of performing a virtual visit and the availability of in person appointments. I also reviewed that there may be a patient responsible charge related to this service. The patient agreed to proceed.   Patient location: home Provider location: [place] at [facility], office Persons participating in this virtual visit: patient, provider   If any vitals were documented, they were collected by patient at home unless specified below.    Ht 5\' 3"  (1.6 m)    Wt 104 lb (47.2 kg)    BMI 18.42 kg/m    CC: Anxiety Subjective:   HPI: Sandra [name] is a 71 y.o. female with a history of hypothyroidism, hypertension, anxiety, insomnia presenting on 09/02/2021 to dicsuss anxiety.  Chronic anxiety for years that have progressed over the last year. She is under a lot of stress regarding her family including son's health and sister in law. Over the last year she's had a difficult time managing her symptoms which have also caused her IBS symptoms to flare.   Anxiety symptoms include tearfulness, feeling jittery, feeling anxious, feeling uneasy. Symptoms occur intermittently, but several times weekly, don't last long. She would like to try something as needed, not daily. She was treated with Xanax years ago, but otherwise has not been treated. She finds relief knowing that she could have something on hand if needed.  Currently managed on Trazodone 150 mg for chronic insomnia, overall finds benefit.       Relevant past medical, surgical, family and social history reviewed and updated as indicated. Interim medical history since our  last visit reviewed. Allergies and medications reviewed and updated. Outpatient Medications Prior to Visit  Medication Sig Dispense Refill   calcium-vitamin D (OSCAL WITH D) 500-200 MG-UNIT tablet Take 2 tablets by mouth daily.     finasteride (PROSCAR) 5 MG tablet finasteride 5 mg tablet     fluticasone (FLONASE) 50 MCG/ACT nasal spray USE ONE TO TWO SPRAYS IN EACH NOSTRIL EVERYDAY IF NEEDED 16 g 3   levothyroxine (SYNTHROID) 112 MCG tablet TAKE 1 TABLET EVERY DAY ON EMPTY STOMACHWITH A GLASS OF WATER AT LEAST 30-60 MINBEFORE BREAKFAST 90 tablet 3   losartan (COZAAR) 100 MG tablet TAKE 1 TABLET BY MOUTH DAILY FOR BLOOD PRESSURE (HIGH).Office visit required for further refills. 90 tablet 0   Melatonin 5 MG TABS Take 5 mg by mouth at bedtime as needed.      meloxicam (MOBIC) 15 MG tablet TAKE 1 TABLET BY MOUTH EVERY DAY AS NEEDED FOR PAIN. TAKE WITH FOOD. Office visit required for further refills. 30 tablet 0   traZODone (DESYREL) 150 MG tablet TAKE 1 TABLET BY MOUTH NIGHTLY AS NEEDED FOR INSOMNIA. Office visit required for further refills. 30 tablet 0   BIOTIN 5000 PO Take one by mouth daily (Patient not taking: Reported on 09/02/2021)     estradiol (ESTRACE) 0.1 MG/GM vaginal cream INSERT 1 GRAM 1 TO 3 TIMES WEEKLY (Patient not taking: Reported on 09/02/2021) 42.5 g 1   minoxidil (LONITEN) 2.5 MG tablet Take 1.25 mg by mouth daily.     spironolactone (ALDACTONE) 50 MG tablet Take 50 mg by mouth daily.  No facility-administered medications prior to visit.     Per HPI unless specifically indicated in ROS section below Review of Systems  Respiratory:  Negative for shortness of breath.   Cardiovascular:  Negative for chest pain.  Psychiatric/Behavioral:  Negative for sleep disturbance. The patient is nervous/anxious.   Objective:  Ht 5\' 3"  (1.6 m)    Wt 104 lb (47.2 kg)    BMI 18.42 kg/m   Wt Readings from Last 3 Encounters:  09/02/21 104 lb (47.2 kg)  07/23/20 105 lb (47.6 kg)  06/21/20  106 lb (48.1 kg)       Physical exam: General: Alert and oriented x 3, no distress, does not appear sickly  Pulmonary: Speaks in complete sentences without increased work of breathing, no cough during visit.  Psychiatric: Normal mood, thought content, and behavior.     Results for orders placed or performed in visit on 10/07/20  HM MAMMOGRAPHY  Result Value Ref Range   HM Mammogram 0-4 Bi-Rad 0-4 Bi-Rad, Self Reported Normal   Assessment & Plan:   Problem List Items Addressed This Visit       Other   GAD (generalized anxiety disorder)    Chronic, increased symptoms.  Discussed options including hydroxyzine for PRN use and/or SSRI. She opts for hydroxyzine to start.  Rx for hydroxyzine 10 mg sent to pharmacy. We discussed that she can take this up to twice daily, and also that it may cause drowsiness.   We will see her for follow up in 1 month.      Relevant Medications   hydrOXYzine (ATARAX) 10 MG tablet     Meds ordered this encounter  Medications   hydrOXYzine (ATARAX) 10 MG tablet    Sig: Take 1 tablet (10 mg total) by mouth 2 (two) times daily as needed for anxiety.    Dispense:  60 tablet    Refill:  0    Order Specific Question:   Supervising Provider    Answer:   BEDSOLE, AMY E [[id number]]   No orders of the defined types were placed in this encounter.   I discussed the assessment and treatment plan with the patient. The patient was provided an opportunity to ask questions and all were answered. The patient agreed with the plan and demonstrated an understanding of the instructions. The patient was advised to call back or seek an in-person evaluation if the symptoms worsen or if the condition fails to improve as anticipated.  Follow up plan:  You may take hydroxyzine 10 mg once or twice daily as needed for anxiety. This may cause drowsiness.   Please update me if anything changes!  See you next month!  Pleas Koch, NP

## 2021-09-06 ENCOUNTER — Other Ambulatory Visit: Payer: Self-pay | Admitting: Primary Care

## 2021-09-06 DIAGNOSIS — E039 Hypothyroidism, unspecified: Secondary | ICD-10-CM

## 2021-09-06 DIAGNOSIS — G4700 Insomnia, unspecified: Secondary | ICD-10-CM

## 2021-09-10 DIAGNOSIS — H2513 Age-related nuclear cataract, bilateral: Secondary | ICD-10-CM | POA: Diagnosis not present

## 2021-09-22 ENCOUNTER — Other Ambulatory Visit: Payer: Self-pay | Admitting: Primary Care

## 2021-09-22 DIAGNOSIS — M1990 Unspecified osteoarthritis, unspecified site: Secondary | ICD-10-CM

## 2021-09-26 ENCOUNTER — Other Ambulatory Visit: Payer: Self-pay

## 2021-09-26 ENCOUNTER — Telehealth (INDEPENDENT_AMBULATORY_CARE_PROVIDER_SITE_OTHER): Payer: PPO | Admitting: Primary Care

## 2021-09-26 VITALS — Temp 101.0°F | Ht 63.0 in | Wt 104.0 lb

## 2021-09-26 DIAGNOSIS — U071 COVID-19: Secondary | ICD-10-CM

## 2021-09-26 HISTORY — DX: COVID-19: U07.1

## 2021-09-26 MED ORDER — NIRMATRELVIR/RITONAVIR (PAXLOVID)TABLET: 3.0000 | ORAL_TABLET | Freq: Two times a day (BID) | ORAL | 0 refills | Status: AC

## 2021-09-26 NOTE — Progress Notes (Signed)
Patient ID: Sandra [name], female    DOB: 04/19/1951, 71 y.o.   MRN: [id number]  Virtual visit completed through [place], a video enabled telemedicine application. Due to national recommendations of social distancing due to COVID-19, a virtual visit is felt to be most appropriate for this patient at this time. Reviewed limitations, risks, security and privacy concerns of performing a virtual visit and the availability of in person appointments. I also reviewed that there may be a patient responsible charge related to this service. The patient agreed to proceed.   Patient location: home Provider location: [place] at [facility], office Persons participating in this virtual visit: patient, provider   If any vitals were documented, they were collected by patient at home unless specified below.    Temp (!) 101 F (38.3 C) (Oral)    Ht 5\' 3"  (1.6 m)    Wt 104 lb (47.2 kg)    BMI 18.42 kg/m    CC: Positive Covid-19 infection Subjective:   HPI: Sandra [name] is a 71 y.o. female with a history of hypertension, hypothyroidism, GAD presenting on 09/26/2021 to discuss Covid-19 infection.  Symptoms onset yesterday with fatigue and scratchy throat. This morning she woke up with a fever of 101 and a cough.   She tested positive for Covid-19 today through a home test. She's been taking vitamin C and D, and zinc. She's completed 2 prior Covid-19 vaccines. Today she felt worse when waking, is feeling somewhat better right now. She's interested in other treatment options.   She has been home and quarantined today.       Relevant past medical, surgical, family and social history reviewed and updated as indicated. Interim medical history since our last visit reviewed. Allergies and medications reviewed and updated. Outpatient Medications Prior to Visit  Medication Sig Dispense Refill   calcium-vitamin D (OSCAL WITH D) 500-200 MG-UNIT tablet Take 2 tablets by mouth daily.     finasteride  (PROSCAR) 5 MG tablet finasteride 5 mg tablet     fluticasone (FLONASE) 50 MCG/ACT nasal spray USE ONE TO TWO SPRAYS IN EACH NOSTRIL EVERYDAY IF NEEDED 16 g 3   hydrOXYzine (ATARAX) 10 MG tablet Take 1 tablet (10 mg total) by mouth 2 (two) times daily as needed for anxiety. 60 tablet 0   levothyroxine (SYNTHROID) 112 MCG tablet Take 1 tablet by mouth every morning on an empty stomach with water only.  No food or other medications for 30 minutes. Office visit required for further refills. 30 tablet 0   losartan (COZAAR) 100 MG tablet TAKE 1 TABLET BY MOUTH DAILY FOR BLOOD PRESSURE (HIGH).Office visit required for further refills. 90 tablet 0   Melatonin 5 MG TABS Take 5 mg by mouth at bedtime as needed.      meloxicam (MOBIC) 15 MG tablet TAKE 1 TABLET BY MOUTH EVERY DAY AS NEEDED FOR PAIN. TAKE WITH FOOD. Office visit required for further refills. 30 tablet 0   minoxidil (LONITEN) 2.5 MG tablet Take 1.25 mg by mouth daily.     traZODone (DESYREL) 150 MG tablet TAKE 1 TABLET BY MOUTH NIGHTLY AS NEEDEDFOR INSOMNIA. Office visit required for further refills. 90 tablet 0   BIOTIN 5000 PO Take one by mouth daily (Patient not taking: Reported on 09/02/2021)     estradiol (ESTRACE) 0.1 MG/GM vaginal cream INSERT 1 GRAM 1 TO 3 TIMES WEEKLY (Patient not taking: Reported on 09/02/2021) 42.5 g 1   No facility-administered medications prior to visit.  Per HPI unless specifically indicated in ROS section below Review of Systems  Constitutional:  Positive for fatigue and fever.  HENT:  Positive for sore throat.   Respiratory:  Positive for cough.   Objective:  Temp (!) 101 F (38.3 C) (Oral)    Ht 5\' 3"  (1.6 m)    Wt 104 lb (47.2 kg)    BMI 18.42 kg/m   Wt Readings from Last 3 Encounters:  09/26/21 104 lb (47.2 kg)  09/02/21 104 lb (47.2 kg)  07/23/20 105 lb (47.6 kg)       Physical exam: General: Alert and oriented x 3, no distress, does appear sickly  Pulmonary: Speaks in complete sentences  without increased work of breathing, no cough during visit.  Psychiatric: Normal mood, thought content, and behavior.     Results for orders placed or performed in visit on 10/07/20  HM MAMMOGRAPHY  Result Value Ref Range   HM Mammogram 0-4 Bi-Rad 0-4 Bi-Rad, Self Reported Normal   Assessment & Plan:   Problem List Items Addressed This Visit       Other   COVID-19 virus infection - Primary    Qualifies for antiviral treatment and is within the timeframe for treatment.  Discussed treatment options, she elects for antiviral treatment. Last renal function reviewed. She is not on anticoagulants.  Rx for Paxlovid sent to pharmacy. Discussed instructions for administration and potential side effects. Return precautions provided.   Discussed quarantine recommendations per the CDC.  She appears stable for outpatient treatment.      Relevant Medications   nirmatrelvir/ritonavir EUA (PAXLOVID) 20 x 150 MG & 10 x 100MG  TABS     Meds ordered this encounter  Medications   nirmatrelvir/ritonavir EUA (PAXLOVID) 20 x 150 MG & 10 x 100MG  TABS    Sig: Take 3 tablets by mouth 2 (two) times daily for 5 days. Patient GFR is 88    Dispense:  30 tablet    Refill:  0    Order Specific Question:   Supervising Provider    Answer:   BEDSOLE, AMY E [[id number]]   No orders of the defined types were placed in this encounter.   I discussed the assessment and treatment plan with the patient. The patient was provided an opportunity to ask questions and all were answered. The patient agreed with the plan and demonstrated an understanding of the instructions. The patient was advised to call back or seek an in-person evaluation if the symptoms worsen or if the condition fails to improve as anticipated.  Follow up plan:  Start Paxlovid antiviral treatment for your infection. Take 3 capsules by mouth twice daily for five days.  You may continue the vitamins as discussed.  Reach out with any  questions!  Allie Bossier, NP-C   Pleas Koch, NP

## 2021-09-26 NOTE — Patient Instructions (Signed)
Start Paxlovid antiviral treatment for your infection. Take 3 capsules by mouth twice daily for five days.  You may continue the vitamins as discussed.  Reach out with any questions!  Allie Bossier, NP-C

## 2021-09-26 NOTE — Assessment & Plan Note (Signed)
Qualifies for antiviral treatment and is within the timeframe for treatment.  Discussed treatment options, she elects for antiviral treatment. Last renal function reviewed. She is not on anticoagulants.  Rx for Paxlovid sent to pharmacy. Discussed instructions for administration and potential side effects. Return precautions provided.   Discussed quarantine recommendations per the CDC.  She appears stable for outpatient treatment.

## 2021-09-30 ENCOUNTER — Other Ambulatory Visit: Payer: Self-pay | Admitting: Primary Care

## 2021-09-30 DIAGNOSIS — I10 Essential (primary) hypertension: Secondary | ICD-10-CM

## 2021-10-02 ENCOUNTER — Ambulatory Visit (INDEPENDENT_AMBULATORY_CARE_PROVIDER_SITE_OTHER): Payer: PPO | Admitting: Primary Care

## 2021-10-02 ENCOUNTER — Other Ambulatory Visit: Payer: Self-pay

## 2021-10-02 ENCOUNTER — Encounter: Payer: Self-pay | Admitting: Primary Care

## 2021-10-02 VITALS — BP 110/60 | HR 78 | Temp 98.6°F | Ht 63.0 in | Wt 100.0 lb

## 2021-10-02 DIAGNOSIS — N898 Other specified noninflammatory disorders of vagina: Secondary | ICD-10-CM

## 2021-10-02 DIAGNOSIS — E2839 Other primary ovarian failure: Secondary | ICD-10-CM | POA: Diagnosis not present

## 2021-10-02 DIAGNOSIS — M1990 Unspecified osteoarthritis, unspecified site: Secondary | ICD-10-CM

## 2021-10-02 DIAGNOSIS — Z0001 Encounter for general adult medical examination with abnormal findings: Secondary | ICD-10-CM

## 2021-10-02 DIAGNOSIS — E039 Hypothyroidism, unspecified: Secondary | ICD-10-CM | POA: Diagnosis not present

## 2021-10-02 DIAGNOSIS — Z1231 Encounter for screening mammogram for malignant neoplasm of breast: Secondary | ICD-10-CM | POA: Diagnosis not present

## 2021-10-02 DIAGNOSIS — L659 Nonscarring hair loss, unspecified: Secondary | ICD-10-CM

## 2021-10-02 DIAGNOSIS — R143 Flatulence: Secondary | ICD-10-CM

## 2021-10-02 DIAGNOSIS — R195 Other fecal abnormalities: Secondary | ICD-10-CM | POA: Diagnosis not present

## 2021-10-02 DIAGNOSIS — I10 Essential (primary) hypertension: Secondary | ICD-10-CM | POA: Diagnosis not present

## 2021-10-02 DIAGNOSIS — R5382 Chronic fatigue, unspecified: Secondary | ICD-10-CM

## 2021-10-02 DIAGNOSIS — F411 Generalized anxiety disorder: Secondary | ICD-10-CM | POA: Diagnosis not present

## 2021-10-02 DIAGNOSIS — G4700 Insomnia, unspecified: Secondary | ICD-10-CM | POA: Diagnosis not present

## 2021-10-02 HISTORY — DX: Other fecal abnormalities: R19.5

## 2021-10-02 MED ORDER — DICYCLOMINE HCL 10 MG PO CAPS: 10.0000 mg | ORAL_CAPSULE | Freq: Three times a day (TID) | ORAL | 0 refills | Status: DC

## 2021-10-02 MED ORDER — MELOXICAM 15 MG PO TABS: ORAL_TABLET | ORAL | 3 refills | Status: DC

## 2021-10-02 MED ORDER — ESTRADIOL 0.1 MG/GM VA CREA: TOPICAL_CREAM | VAGINAL | 1 refills | Status: DC

## 2021-10-02 MED ORDER — LOSARTAN POTASSIUM 50 MG PO TABS: 50.0000 mg | ORAL_TABLET | Freq: Every day | ORAL | 3 refills | Status: DC

## 2021-10-02 NOTE — Assessment & Plan Note (Addendum)
Slight improvement.  Continue hydroxyzine 10 mg PRN.  She declines SSRI treatment.

## 2021-10-02 NOTE — Patient Instructions (Addendum)
Stop by the lab prior to leaving today. I will notify you of your results once received.   [facility] and bone density scan.  Return the stool kit as soon as possible.  You may try dicyclomine for gas and loose stools. Take 1 capsules by mouth three times daily before meals.   We reduced the dose of your losartan to 50 mg for blood pressure. Monitor your blood pressure. It should not run at or higher than 140 on top and 90 on bottom.  It was a pleasure to see you today!  Preventive Care 69 Years and Older, Female Preventive care refers to lifestyle choices and visits with your health care provider that can promote health and wellness. Preventive care visits are also called wellness exams. What can I expect for my preventive care visit? Counseling Your health care provider may ask you questions about your: Medical history, including: Past medical problems. Family medical history. Pregnancy and menstrual history. History of falls. Current health, including: Memory and ability to understand (cognition). Emotional well-being. Home life and relationship well-being. Sexual activity and sexual health. Lifestyle, including: Alcohol, nicotine or tobacco, and drug use. Access to firearms. Diet, exercise, and sleep habits. Work and work Statistician. Sunscreen use. Safety issues such as seatbelt and bike helmet use. Physical exam Your health care provider will check your: Height and weight. These may be used to calculate your BMI (body mass index). BMI is a measurement that tells if you are at a healthy weight. Waist circumference. This measures the distance around your waistline. This measurement also tells if you are at a healthy weight and may help predict your risk of certain diseases, such as type 2 diabetes and high blood pressure. Heart rate and blood pressure. Body temperature. Skin for abnormal spots. What immunizations do I need? Vaccines  are usually given at various ages, according to a schedule. Your health care provider will recommend vaccines for you based on your age, medical history, and lifestyle or other factors, such as travel or where you work. What tests do I need? Screening Your health care provider may recommend screening tests for certain conditions. This may include: Lipid and cholesterol levels. Hepatitis C test. Hepatitis B test. HIV (human immunodeficiency virus) test. STI (sexually transmitted infection) testing, if you are at risk. Lung cancer screening. Colorectal cancer screening. Diabetes screening. This is done by checking your blood sugar (glucose) after you have not eaten for a while (fasting). Mammogram. Talk with your health care provider about how often you should have regular mammograms. BRCA-related cancer screening. This may be done if you have a family history of breast, ovarian, tubal, or peritoneal cancers. Bone density scan. This is done to screen for osteoporosis. Talk with your health care provider about your test results, treatment options, and if necessary, the need for more tests. Follow these instructions at home: Eating and drinking  Eat a diet that includes fresh fruits and vegetables, whole grains, lean protein, and low-fat dairy products. Limit your intake of foods with high amounts of sugar, saturated fats, and salt. Take vitamin and mineral supplements as recommended by your health care provider. Do not drink alcohol if your health care provider tells you not to drink. If you drink alcohol: Limit how much you have to 0-1 drink a day. Know how much alcohol is in your drink. In the [place] one drink equals one 12 oz bottle of beer (355 mL), one 5 oz glass of wine (  148 mL), or one 1 oz glass of hard liquor (44 mL). Lifestyle Brush your teeth every morning and night with fluoride toothpaste. Floss one time each day. Exercise for at least 30 minutes 5 or more days each week. Do not  use any products that contain nicotine or tobacco. These products include cigarettes, chewing tobacco, and vaping devices, such as e-cigarettes. If you need help quitting, ask your health care provider. Do not use drugs. If you are sexually active, practice safe sex. Use a condom or other form of protection in order to prevent STIs. Take aspirin only as told by your health care provider. Make sure that you understand how much to take and what form to take. Work with your health care provider to find out whether it is safe and beneficial for you to take aspirin daily. Ask your health care provider if you need to take a cholesterol-lowering medicine (statin). Find healthy ways to manage stress, such as: Meditation, yoga, or listening to music. Journaling. Talking to a trusted person. Spending time with friends and family. Minimize exposure to UV radiation to reduce your risk of skin cancer. Safety Always wear your seat belt while driving or riding in a vehicle. Do not drive: If you have been drinking alcohol. Do not ride with someone who has been drinking. When you are tired or distracted. While texting. If you have been using any mind-altering substances or drugs. Wear a helmet and other protective equipment during sports activities. If you have firearms in your house, make sure you follow all gun safety procedures. What's next? Visit your health care provider once a year for an annual wellness visit. Ask your health care provider how often you should have your eyes and teeth checked. Stay up to date on all vaccines. This information is not intended to replace advice given to you by your health care provider. Make sure you discuss any questions you have with your health care provider. Document Revised: 01/29/2021 Document Reviewed: 01/29/2021 Elsevier Patient Education  [place].

## 2021-10-02 NOTE — Assessment & Plan Note (Signed)
Following with dermatology.  Continue minoxidil 1.25 mg daily and finasteride 5 mg daily.

## 2021-10-02 NOTE — Addendum Note (Signed)
Addended by: Ellamae Sia on: 10/02/2021 03:05 PM   Modules accepted: Orders

## 2021-10-02 NOTE — Assessment & Plan Note (Addendum)
Unclear cause at this time. Abdominal exam unremarkable.  She will resume her probiotic.  Trial of dicyclomine 10 mg sent to pharmacy to take TID PRN.   Consider SSRI treatment for anxiety.  Checking labs including stool studies.

## 2021-10-02 NOTE — Assessment & Plan Note (Signed)
Unclear cause at this time. Abdominal exam unremarkable.  She will resume her probiotic.  Trial of dicyclomine 10 mg sent to pharmacy to take TID PRN.   Consider SSRI treatment for anxiety.  Checking labs.

## 2021-10-02 NOTE — Assessment & Plan Note (Signed)
Shingrix due, she will obtain from her pharmacy. Mammogram and bone density scan due, orders placaed. Colonoscopy UTD, due 2026.  Exam today as noted. Labs pending.

## 2021-10-02 NOTE — Assessment & Plan Note (Signed)
Could be multifactorial including anxiety, malabsorption of levothyroxine, vitamin deficiency.  Labs pending. Discussed correct instructions for taking levothyroxine.  Consider daily SSRI for anxiety.  Await results.

## 2021-10-02 NOTE — Assessment & Plan Note (Addendum)
She is taking levothyroxine 112 mcg incorrectly, sometimes she eats, takes vitamin C in the AM.  Repeat TSH pending.  Continue levothyroxine 112 mcg daily.

## 2021-10-02 NOTE — Progress Notes (Signed)
Subjective:    Patient ID: Sandra [name], female    DOB: 06-08-1951, 71 y.o.   MRN: [id number]  HPI  Sandra [name] is a very pleasant 71 y.o. female who presents today for complete physical and follow up of chronic conditions. She would like to discuss a few issues.  She would also like to mention loose stools and gas which began a few months ago. Her stools occur every other day. Her gas occurs daily and is bothersome. She denies abdominal pain, nausea, vomiting, rectal bleeding, changes in food, new supplements. She's been taking Imodium and Gas X several times weekly with temporary improvement. She is requesting treatment, thinks she may have IBS. She has suffered with increased anxiety over the last year, this has improved somewhat with hydroxyzine. Her gas has caused a decrease in her intake of food, has lost weight.  She would also like to mention chronic fatigue, occurs several nights weekly. Does sleep decently with her trazodone. She would like B12 checked. She is under a lot of stress, increased over the last year. Appetite has decreased given gas symptoms.   Immunizations: -Tetanus: 2017 -Influenza: Completed the season -Covid-19: 2 vaccines -Shingles: Never completed, will get at the pharmacy  -Pneumonia: Prevnar 13 in 2019, Pneumovax 23 in 2020  Diet: [place].  Exercise: Walking  Eye exam: Completes annually  Dental exam: Completes semi-annually   Mammogram: Completed in February 2022 Dexa: Completed in 2020 Colonoscopy: Completed in 2021, due 2026  BP Readings from Last 3 Encounters:  10/02/21 110/60  07/23/20 119/78  06/21/20 (!) 148/88   Wt Readings from Last 3 Encounters:  10/02/21 100 lb (45.4 kg)  09/26/21 104 lb (47.2 kg)  09/02/21 104 lb (47.2 kg)       Review of Systems  Constitutional:  Positive for fatigue. Negative for unexpected weight change.  HENT:  Negative for rhinorrhea.   Respiratory:  Negative for shortness of breath.    Cardiovascular:  Negative for chest pain.  Gastrointestinal:  Negative for constipation and diarrhea.       Lose stools, gas   Genitourinary:  Negative for difficulty urinating.  Musculoskeletal:  Negative for arthralgias and myalgias.  Skin:  Negative for rash.  Allergic/Immunologic: Negative for environmental allergies.  Neurological:  Negative for dizziness and headaches.  Psychiatric/Behavioral:  Negative for sleep disturbance. The patient is nervous/anxious.         Past Medical History:  Diagnosis Date   COVID-19 virus infection 09/26/2021   Hypertension    Thyroid disease     Social History   Socioeconomic History   Marital status: Married    Spouse name: Not on file   Number of children: Not on file   Years of education: Not on file   Highest education level: Not on file  Occupational History   Not on file  Tobacco Use   Smoking status: Never   Smokeless tobacco: Never  Vaping Use   Vaping Use: Never used  Substance and Sexual Activity   Alcohol use: Yes   Drug use: No   Sexual activity: Not on file  Other Topics Concern   Not on file  Social History Narrative   Married.   2 children, no grandchildren.   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    Has a living will   Social Determinants  of Health   Financial Resource Strain: Not on file  Food Insecurity: Not on file  Transportation Needs: Not on file  Physical Activity: Not on file  Stress: Not on file  Social Connections: Not on file  Intimate Partner Violence: Not on file    Past Surgical History:  Procedure Laterality Date   CESAREAN SECTION     X 2   COLONOSCOPY WITH PROPOFOL N/A 07/23/2020   Procedure: COLONOSCOPY WITH PROPOFOL;  Surgeon: Virgel Manifold, MD;  Location: [facility] ENDOSCOPY;  Service: Endoscopy;  Laterality: N/A;    Family History  Problem Relation Age of  Onset   Arthritis Mother        s/p hip replacement   Hyperlipidemia Mother    Heart disease Mother    Hypertension Mother    Arthritis Father    Hyperlipidemia Father    Heart disease Father    Hypertension Father    Dementia Father    Thrombocytopenia Son    Hearing loss Son    Thrombocytopenia Son     No Known Allergies  Current Outpatient Medications on File Prior to Visit  Medication Sig Dispense Refill   calcium-vitamin D (OSCAL WITH D) 500-200 MG-UNIT tablet Take 2 tablets by mouth daily.     finasteride (PROSCAR) 5 MG tablet finasteride 5 mg tablet     fluticasone (FLONASE) 50 MCG/ACT nasal spray USE ONE TO TWO SPRAYS IN EACH NOSTRIL EVERYDAY IF NEEDED 16 g 3   hydrOXYzine (ATARAX) 10 MG tablet Take 1 tablet (10 mg total) by mouth 2 (two) times daily as needed for anxiety. 60 tablet 0   levothyroxine (SYNTHROID) 112 MCG tablet Take 1 tablet by mouth every morning on an empty stomach with water only.  No food or other medications for 30 minutes. Office visit required for further refills. 30 tablet 0   Melatonin 5 MG TABS Take 5 mg by mouth at bedtime as needed.      minoxidil (LONITEN) 2.5 MG tablet Take 1.25 mg by mouth daily.     traZODone (DESYREL) 150 MG tablet TAKE 1 TABLET BY MOUTH NIGHTLY AS NEEDEDFOR INSOMNIA. Office visit required for further refills. 90 tablet 0   No current facility-administered medications on file prior to visit.    BP 110/60    Pulse 78    Temp 98.6 F (37 C) (Oral)    Ht 5\' 3"  (1.6 m)    Wt 100 lb (45.4 kg)    SpO2 99%    BMI 17.71 kg/m  Objective:   Physical Exam HENT:     Right Ear: Tympanic membrane and ear canal normal.     Left Ear: Tympanic membrane and ear canal normal.     Nose: Nose normal.  Eyes:     Conjunctiva/sclera: Conjunctivae normal.     Pupils: Pupils are equal, round, and reactive to light.  Neck:     Thyroid: No thyromegaly.  Cardiovascular:     Rate and Rhythm: Normal rate and regular rhythm.     Heart sounds:  No murmur heard. Pulmonary:     Effort: Pulmonary effort is normal.     Breath sounds: Normal breath sounds. No rales.  Abdominal:     General: Bowel sounds are normal.     Palpations: Abdomen is soft.     Tenderness: There is no abdominal tenderness.  Musculoskeletal:        General: Normal range of motion.     Cervical back: Neck supple.  Lymphadenopathy:  Cervical: No cervical adenopathy.  Skin:    General: Skin is warm and dry.     Findings: No rash.  Neurological:     Mental Status: She is alert and oriented to person, place, and time.     Cranial Nerves: No cranial nerve deficit.     Deep Tendon Reflexes: Reflexes are normal and symmetric.  Psychiatric:        Mood and Affect: Mood normal.          Assessment & Plan:  >35 minutes spent face to face with patient, >50% spent counseling or coordinating care, outside of time for CPE.     This visit occurred during the SARS-CoV-2 public health emergency.  Safety protocols were in place, including screening questions prior to the visit, additional usage of staff PPE, and extensive cleaning of exam room while observing appropriate contact time as indicated for disinfecting solutions.

## 2021-10-02 NOTE — Assessment & Plan Note (Signed)
Borderline too low.  Given weight loss, will reduce dose of losartan to 50 mg.  Start losartan 50 mg daily. She will monitor BP at home.  CMP pending.

## 2021-10-02 NOTE — Assessment & Plan Note (Signed)
Stable. Continue Meloxicam 15 mg for which she will reduce to every other day as needed.  Monitor BP.  CMP pending

## 2021-10-02 NOTE — Assessment & Plan Note (Signed)
No use of Estrace cream but would like to resume.  Rx for Estrace cream sent to pharmacy to use several times weekly

## 2021-10-02 NOTE — Assessment & Plan Note (Signed)
Seems stable overall.  Continue Trazodone 150 mg daily.

## 2021-10-03 ENCOUNTER — Other Ambulatory Visit: Payer: PPO

## 2021-10-03 DIAGNOSIS — R143 Flatulence: Secondary | ICD-10-CM

## 2021-10-03 DIAGNOSIS — R195 Other fecal abnormalities: Secondary | ICD-10-CM

## 2021-10-03 LAB — COMPREHENSIVE METABOLIC PANEL
ALT: 18 U/L (ref 0–35)
AST: 21 U/L (ref 0–37)
Albumin: 4.7 g/dL (ref 3.5–5.2)
Alkaline Phosphatase: 55 U/L (ref 39–117)
BUN: 14 mg/dL (ref 6–23)
CO2: 32 mEq/L (ref 19–32)
Calcium: 9.5 mg/dL (ref 8.4–10.5)
Chloride: 97 mEq/L (ref 96–112)
Creatinine, Ser: 0.69 mg/dL (ref 0.40–1.20)
GFR: 88.55 mL/min (ref >60.00)
Glucose, Bld: 85 mg/dL (ref 70–99)
Potassium: 4.6 mEq/L (ref 3.5–5.1)
Sodium: 132 mEq/L — ABNORMAL LOW (ref 135–145)
Total Bilirubin: 0.7 mg/dL (ref 0.2–1.2)
Total Protein: 6.9 g/dL (ref 6.0–8.3)

## 2021-10-03 LAB — CBC
HCT: 37.7 % (ref 36.0–46.0)
Hemoglobin: 13.1 g/dL (ref 12.0–15.0)
MCHC: 34.7 g/dL (ref 30.0–36.0)
MCV: 91.5 fl (ref 78.0–100.0)
Platelets: 156 10*3/uL (ref 150.0–400.0)
RBC: 4.12 Mil/uL (ref 3.87–5.11)
RDW: 13 % (ref 11.5–15.5)
WBC: 3.4 10*3/uL — ABNORMAL LOW (ref 4.0–10.5)

## 2021-10-03 LAB — LIPID PANEL
Cholesterol: 229 mg/dL — ABNORMAL HIGH (ref 0–200)
HDL: 70.6 mg/dL (ref >39.00)
LDL Cholesterol: 136 mg/dL — ABNORMAL HIGH (ref 0–99)
NonHDL: 158.47
Total CHOL/HDL Ratio: 3
Triglycerides: 111 mg/dL (ref 0.0–149.0)
VLDL: 22.2 mg/dL (ref 0.0–40.0)

## 2021-10-03 LAB — VITAMIN B12: Vitamin B-12: 1500 pg/mL — ABNORMAL HIGH (ref 211–911)

## 2021-10-03 LAB — TSH: TSH: 2.51 u[IU]/mL (ref 0.35–5.50)

## 2021-10-06 LAB — GASTROINTESTINAL PATHOGEN PANEL PCR
C. difficile Tox A/B, PCR: NOT DETECTED
Campylobacter, PCR: NOT DETECTED
Cryptosporidium, PCR: NOT DETECTED
E coli (ETEC) LT/ST PCR: NOT DETECTED
E coli (STEC) stx1/stx2, PCR: NOT DETECTED
E coli 0157, PCR: NOT DETECTED
Giardia lamblia, PCR: NOT DETECTED
Norovirus, PCR: NOT DETECTED
Rotavirus A, PCR: NOT DETECTED
Salmonella, PCR: NOT DETECTED
Shigella, PCR: NOT DETECTED

## 2021-10-11 ENCOUNTER — Other Ambulatory Visit: Payer: Self-pay | Admitting: Primary Care

## 2021-10-11 DIAGNOSIS — E039 Hypothyroidism, unspecified: Secondary | ICD-10-CM

## 2021-10-17 ENCOUNTER — Other Ambulatory Visit: Payer: Self-pay | Admitting: Primary Care

## 2021-10-17 DIAGNOSIS — R195 Other fecal abnormalities: Secondary | ICD-10-CM

## 2021-10-17 DIAGNOSIS — R143 Flatulence: Secondary | ICD-10-CM

## 2021-10-22 NOTE — Telephone Encounter (Signed)
Is it ok to make office visit for sinus issues or do you want to be virtual?  ?

## 2021-10-22 NOTE — Telephone Encounter (Signed)
Yes, needs an appointment.  ?In person or virtual, either. ?

## 2021-10-24 ENCOUNTER — Telehealth (INDEPENDENT_AMBULATORY_CARE_PROVIDER_SITE_OTHER): Payer: PPO | Admitting: Family

## 2021-10-24 ENCOUNTER — Other Ambulatory Visit: Payer: Self-pay

## 2021-10-24 DIAGNOSIS — J0100 Acute maxillary sinusitis, unspecified: Secondary | ICD-10-CM | POA: Insufficient documentation

## 2021-10-24 MED ORDER — METHYLPREDNISOLONE 4 MG PO TBPK: ORAL_TABLET | ORAL | 0 refills | Status: DC

## 2021-10-24 MED ORDER — AMOXICILLIN-POT CLAVULANATE 875-125 MG PO TABS: 1.0000 | ORAL_TABLET | Freq: Two times a day (BID) | ORAL | 0 refills | Status: DC

## 2021-10-24 NOTE — Assessment & Plan Note (Addendum)
Prescription given for augmentin 875/125 mg po bid for ten days and also sent medrol dose pack for sinus pressure/pain. Pt to continue tylenol/ibuprofen prn sinus pain. Continue with humidifier prn and steam showers recommended as well. instructed If no symptom improvement in 48 hours please f/u ? ?

## 2021-10-24 NOTE — Progress Notes (Signed)
? ? ?MyChart Video Visit ? ? ? ?Virtual Visit via Video Note  ? ?This visit type was conducted due to national recommendations for restrictions regarding the COVID-19 Pandemic (e.g. social distancing) in an effort to limit this patient's exposure and mitigate transmission in our community. This patient is at least at moderate risk for complications without adequate follow up. This format is felt to be most appropriate for this patient at this time. Physical exam was limited by quality of the video and audio technology used for the visit. CMA was able to get the patient set up on a video visit. ? ?Patient location: Home. Patient and provider in visit ?Provider location: Office ? ?I discussed the limitations of evaluation and management by telemedicine and the availability of in person appointments. The patient expressed understanding and agreed to proceed. ? ?Visit Date: 10/24/2021 ? ?Today's healthcare provider: Eugenia Pancoast, FNP  ? ? ? ?Subjective:  ? ? Patient ID: Sandra [name], female    DOB: 24-Jun-1951, 71 y.o.   MRN: [id number] ? ?Chief Complaint  ?Patient presents with  ? Nasal Congestion  ?  Pt stated--congested left side, gum/teeth, cheek pressure pain.--Tries Flonase, sudafed , and zyrtec--over 1 month.  ? ? ?HPI ? ?71 y/o female here with concerns via video visit.  ? ?Sinus pressure and nasal congestion mainly on the left side, feels it in her teeth/gums as well. Left ear pain and slight sore throat. Feels sore lymph node on her left side. No chest congestion. No cough. No fever or chills.  ? ?Using flonase and decongestant as well as zyrtec.  ?Had covid one month ago, hasn't felt great since then but sinus pressure/pain started about one week ago.  ? ? ?Past Medical History:  ?Diagnosis Date  ? COVID-19 virus infection 09/26/2021  ? Hypertension   ? Thyroid disease   ? ? ?Past Surgical History:  ?Procedure Laterality Date  ? CESAREAN SECTION    ? X 2  ? COLONOSCOPY WITH PROPOFOL N/A 07/23/2020  ?  Procedure: COLONOSCOPY WITH PROPOFOL;  Surgeon: Virgel Manifold, MD;  Location: [facility] END[facility]COPY;  Service: Endoscopy;  Laterality: N/A;  ? ? ?Family History  ?Problem Relation Age of Onset  ? Arthritis Mother   ?     s/p hip replacement  ? Hyperlipidemia Mother   ? Heart disease Mother   ? Hypertension Mother   ? Arthritis Father   ? Hyperlipidemia Father   ? Heart disease Father   ? Hypertension Father   ? Dementia Father   ? Thrombocytopenia Son   ? Hearing loss Son   ? Thrombocytopenia Son   ? ? ?Social History  ? ?Socioeconomic History  ? Marital status: Married  ?  Spouse name: Not on file  ? Number of children: Not on file  ? Years of education: Not on file  ? Highest education level: Not on file  ?Occupational History  ? Not on file  ?Tobacco Use  ? Smoking status: Never  ? Smokeless tobacco: Never  ?Vaping Use  ? Vaping Use: Never used  ?Substance and Sexual Activity  ? Alcohol use: Yes  ? Drug use: No  ? Sexual activity: Not on file  ?Other Topics Concern  ? Not on file  ?Social History Narrative  ? Married.  ? 2 children, no grandchildren.  ? 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 Diet - regular  ? Exercise - gym 4x per week  ? Enjoys playing with her  dog, walking, exercising.   ? Has a living will  ? ?Social Determinants of Health  ? ?Financial Resource Strain: Not on file  ?Food Insecurity: Not on file  ?Transportation Needs: Not on file  ?Physical Activity: Not on file  ?Stress: Not on file  ?Social Connections: Not on file  ?Intimate Partner Violence: Not on file  ? ? ?Outpatient Medications Prior to Visit  ?Medication Sig Dispense Refill  ? calcium-vitamin D (OSCAL WITH D) 500-200 MG-UNIT tablet Take 2 tablets by mouth daily.    ? dicyclomine (BENTYL) 10 MG capsule Take 1 capsule (10 mg total) by mouth 3 (three) times daily before meals. As needed for gas and loose stools. 30 capsule 0  ? estradiol (ESTRACE) 0.1 MG/GM vaginal cream INSERT 1 GRAM 1 TO 3 TIMES WEEKLY 42.5 g 1  ?  finasteride (PROSCAR) 5 MG tablet finasteride 5 mg tablet    ? fluticasone (FLONASE) 50 MCG/ACT nasal spray USE ONE TO TWO SPRAYS IN EACH NOSTRIL EVERYDAY IF NEEDED 16 g 3  ? hydrOXYzine (ATARAX) 10 MG tablet Take 1 tablet (10 mg total) by mouth 2 (two) times daily as needed for anxiety. 60 tablet 0  ? levothyroxine (SYNTHROID) 112 MCG tablet Take 1 tablet by mouth every morning on an empty stomach with water only.  No food or other medications for 30 minutes. 90 tablet 3  ? losartan (COZAAR) 50 MG tablet Take 1 tablet (50 mg total) by mouth daily. For blood pressure. 90 tablet 3  ? Melatonin 5 MG TABS Take 5 mg by mouth at bedtime as needed.     ? meloxicam (MOBIC) 15 MG tablet TAKE 1 TABLET BY MOUTH EVERY DAY AS NEEDED FOR PAIN. TAKE WITH FOOD. 90 tablet 3  ? minoxidil (LONITEN) 2.5 MG tablet Take 1.25 mg by mouth daily.    ? traZODone (DESYREL) 150 MG tablet TAKE 1 TABLET BY MOUTH NIGHTLY AS NEEDEDFOR INSOMNIA. Office visit required for further refills. 90 tablet 0  ? ?No facility-administered medications prior to visit.  ? ? ?No Known Allergies ? ?Review of Systems  ?Constitutional:  Negative for chills and fever.  ?HENT:  Positive for congestion, ear pain (left side), sinus pain (left side) and sore throat.   ?Respiratory:  Negative for cough, sputum production and shortness of breath.   ?Cardiovascular:  Negative for chest pain.  ?All other systems reviewed and are negative. ? ?   ?Objective:  ?  ?Physical Exam ?Constitutional:   ?   General: She is not in acute distress. ?   Appearance: Normal appearance. She is not ill-appearing.  ?Pulmonary:  ?   Effort: Pulmonary effort is normal.  ?Neurological:  ?   General: No focal deficit present.  ?   Mental Status: She is alert and oriented to person, place, and time.  ?Psychiatric:     ?   Mood and Affect: Mood normal.     ?   Behavior: Behavior normal.     ?   Thought Content: Thought content normal.  ? ? ?There were no vitals taken for this visit. ?Wt Readings  from Last 3 Encounters:  ?10/02/21 100 lb (45.4 kg)  ?09/26/21 104 lb (47.2 kg)  ?09/02/21 104 lb (47.2 kg)  ? ? ?   ?Assessment & Plan:  ? ?Problem List Items Addressed This Visit   ? ?  ? Respiratory  ? Acute non-recurrent maxillary sinusitis - Primary  ?  Prescription given for augmentin 875/125 mg po bid for ten  days and also sent medrol dose pack for sinus pressure/pain. Pt to continue tylenol/ibuprofen prn sinus pain. Continue with humidifier prn and steam showers recommended as well. instructed If no symptom improvement in 48 hours please f/u ? ?  ?  ? Relevant Medications  ? methylPREDNISolone (MEDROL DOSEPAK) 4 MG TBPK tablet  ? amoxicillin-clavulanate (AUGMENTIN) 875-125 MG tablet  ? ? ?I am having Wanda Plump. Fede start on methylPREDNISolone and amoxicillin-clavulanate. I am also having her maintain her fluticasone, melatonin, calcium-vitamin D, finasteride, minoxidil, hydrOXYzine, traZODone, estradiol, losartan, meloxicam, dicyclomine, and levothyroxine. ? ?Meds ordered this encounter  ?Medications  ? methylPREDNISolone (MEDROL DOSEPAK) 4 MG TBPK tablet  ?  Sig: Take per package instructions  ?  Dispense:  21 tablet  ?  Refill:  0  ?  Order Specific Question:   Supervising Provider  ?  Answer:   BEDSOLE, AMY E [[id number]]  ? amoxicillin-clavulanate (AUGMENTIN) 875-125 MG tablet  ?  Sig: Take 1 tablet by mouth 2 (two) times daily.  ?  Dispense:  20 tablet  ?  Refill:  0  ?  Order Specific Question:   Supervising Provider  ?  Answer:   BEDSOLE, AMY E [[id number]]  ? ? ?I discussed the assessment and treatment plan with the patient. The patient was provided an opportunity to ask questions and all were answered. The patient agreed with the plan and demonstrated an understanding of the instructions. ?  ?The patient was advised to call back or seek an in-person evaluation if the symptoms worsen or if the condition fails to improve as anticipated. ? ?I provided 18 minutes of face-to-face time during this  encounter. ? ? ?Eugenia Pancoast, FNP ?Therapist, music at [facility] ?[phone] (phone) ?[phone] (fax) ? ?[facility]  ?

## 2021-11-08 ENCOUNTER — Ambulatory Visit
Admission: RE | Admit: 2021-11-08 | Discharge: 2021-11-08 | Disposition: A | Discharge status: Home / Self Care | Payer: PPO | Source: Ambulatory Visit | Attending: Emergency Medicine | Admitting: Emergency Medicine

## 2021-11-08 ENCOUNTER — Other Ambulatory Visit: Payer: Self-pay

## 2021-11-08 VITALS — BP 139/85 | HR 100 | Temp 98.2°F | Resp 18

## 2021-11-08 DIAGNOSIS — B370 Candidal stomatitis: Secondary | ICD-10-CM

## 2021-11-08 DIAGNOSIS — H9203 Otalgia, bilateral: Secondary | ICD-10-CM

## 2021-11-08 DIAGNOSIS — H6123 Impacted cerumen, bilateral: Secondary | ICD-10-CM | POA: Diagnosis not present

## 2021-11-08 MED ORDER — NYSTATIN 100000 UNIT/ML MT SUSP: 500000.0000 [IU] | Freq: Four times a day (QID) | OROMUCOSAL | 0 refills | Status: DC

## 2021-11-08 NOTE — ED Triage Notes (Signed)
Pt here with bilateral ear fullness x several weeks. On abx that just finished Monday with no resolution of ear sx.  ?

## 2021-11-08 NOTE — ED Provider Notes (Signed)
?[facility] ? ? ? ?CSN: [id number] ?Arrival date & time: 11/08/21  2671 ? ? ?  ? ?History   ?Chief Complaint ?Chief Complaint  ?Patient presents with  ? Ear Fullness  ?  sinus drainage - Entered by patient  ? ? ?HPI ?Sandra [name] is a 71 y.o. female.  Patient presents with bilateral ear fullness, worse on the left side, x several weeks.  She just completed treatment for a sinus infection.  She denies actual ear pain but has sensation of fullness.  No ear drainage or change in hearing.  She also has tongue soreness x 1 week.  No fever, chills, sore throat, cough, shortness of breath, or other symptoms.  Treatment at home with Sudafed.  Patient had a video visit on 10/24/2021; diagnosed with sinusitis and treated with Augmentin and methylprednisolone.  She had COVID in February 2023.  Her medical history includes hypertension, hypothyroidism, chronic fatigue, anxiety. ? ?The history is provided by the patient and medical records.  ? ?Past Medical History:  ?Diagnosis Date  ? COVID-19 virus infection 09/26/2021  ? Hypertension   ? Thyroid disease   ? ? ?Patient Active Problem List  ? Diagnosis Date Noted  ? Acute non-recurrent maxillary sinusitis 10/24/2021  ? Alopecia 10/02/2021  ? Flatulence 10/02/2021  ? Loose stools 10/02/2021  ? Chronic fatigue 10/02/2021  ? Encounter for screening colonoscopy   ? Dry mouth 07/01/2020  ? Herpes zoster 05/24/2019  ? Encounter for annual general medical examination with abnormal findings in adult 05/04/2018  ? Vaginal dryness 05/04/2018  ? Welcome to Medicare preventive visit 04/28/2017  ? HTN (hypertension) 04/09/2015  ? Arthritis 02/12/2015  ? Insomnia 02/12/2015  ? GAD (generalized anxiety disorder) 06/23/2012  ? Hypothyroidism 06/23/2012  ? ? ?Past Surgical History:  ?Procedure Laterality Date  ? CESAREAN SECTION    ? X 2  ? COLONOSCOPY WITH PROPOFOL N/A 07/23/2020  ? Procedure: COLONOSCOPY WITH PROPOFOL;  Surgeon: Virgel Manifold, MD;  Location: [facility] ENDOSCOPY;   Service: Endoscopy;  Laterality: N/A;  ? ? ?OB History   ?No obstetric history on file. ?  ? ? ? ?Home Medications   ? ?Prior to Admission medications   ?Medication Sig Start Date End Date Taking? Authorizing Provider  ?nystatin (MYCOSTATIN) 100000 UNIT/ML suspension Take 5 mLs (500,000 Units total) by mouth 4 (four) times daily. 11/08/21  Yes Sharion Balloon, NP  ?amoxicillin-clavulanate (AUGMENTIN) 875-125 MG tablet Take 1 tablet by mouth 2 (two) times daily. 10/24/21   Eugenia Pancoast, FNP  ?calcium-vitamin D (OSCAL WITH D) 500-200 MG-UNIT tablet Take 2 tablets by mouth daily.    [provider]  ?dicyclomine (BENTYL) 10 MG capsule Take 1 capsule (10 mg total) by mouth 3 (three) times daily before meals. As needed for gas and loose stools. 10/02/21   Pleas Koch, NP  ?estradiol (ESTRACE) 0.1 MG/GM vaginal cream INSERT 1 GRAM 1 TO 3 TIMES WEEKLY 10/02/21   Pleas Koch, NP  ?finasteride (PROSCAR) 5 MG tablet finasteride 5 mg tablet    [provider]  ?fluticasone (FLONASE) 50 MCG/ACT nasal spray USE ONE TO TWO SPRAYS IN EACH NOSTRIL EVERYDAY IF NEEDED 09/24/14   Lucille Passy, MD  ?hydrOXYzine (ATARAX) 10 MG tablet Take 1 tablet (10 mg total) by mouth 2 (two) times daily as needed for anxiety. 09/02/21   Pleas Koch, NP  ?levothyroxine (SYNTHROID) 112 MCG tablet Take 1 tablet by mouth every morning on an empty stomach with water only.  No food or other medications for 30 minutes. 10/12/21   Pleas Koch, NP  ?losartan (COZAAR) 50 MG tablet Take 1 tablet (50 mg total) by mouth daily. For blood pressure. 10/02/21   Pleas Koch, NP  ?Melatonin 5 MG TABS Take 5 mg by mouth at bedtime as needed.     [provider]  ?meloxicam (MOBIC) 15 MG tablet TAKE 1 TABLET BY MOUTH EVERY DAY AS NEEDED FOR PAIN. TAKE WITH FOOD. 10/02/21   Pleas Koch, NP  ?methylPREDNISolone (MEDROL DOSEPAK) 4 MG TBPK tablet Take per package instructions 10/24/21   Eugenia Pancoast, FNP   ?minoxidil (LONITEN) 2.5 MG tablet Take 1.25 mg by mouth daily. 06/30/21   [provider]  ?traZODone (DESYREL) 150 MG tablet TAKE 1 TABLET BY MOUTH NIGHTLY AS NEEDEDFOR INSOMNIA. Office visit required for further refills. 09/07/21   Pleas Koch, NP  ? ? ?Family History ?Family History  ?Problem Relation Age of Onset  ? Arthritis Mother   ?     s/p hip replacement  ? Hyperlipidemia Mother   ? Heart disease Mother   ? Hypertension Mother   ? Arthritis Father   ? Hyperlipidemia Father   ? Heart disease Father   ? Hypertension Father   ? Dementia Father   ? Thrombocytopenia Son   ? Hearing loss Son   ? Thrombocytopenia Son   ? ? ?Social History ?Social History  ? ?Tobacco Use  ? Smoking status: Never  ? Smokeless tobacco: Never  ?Vaping Use  ? Vaping Use: Never used  ?Substance Use Topics  ? Alcohol use: Yes  ? Drug use: No  ? ? ? ?Allergies   ?Patient has no known allergies. ? ? ?Review of Systems ?Review of Systems  ?Constitutional:  Negative for chills and fever.  ?HENT:  Negative for ear discharge, ear pain, hearing loss and sore throat.   ?Respiratory:  Negative for cough and shortness of breath.   ?All other systems reviewed and are negative. ? ? ?Physical Exam ?Triage Vital Signs ?ED Triage Vitals  ?Enc Vitals Group  ?   BP   ?   Pulse   ?   Resp   ?   Temp   ?   Temp src   ?   SpO2   ?   Weight   ?   Height   ?   Head Circumference   ?   Peak Flow   ?   Pain Score   ?   Pain Loc   ?   Pain Edu?   ?   Excl. in [place]?   ? ?No data found. ? ?Updated Vital Signs ?BP 139/85   Pulse 100   Temp 98.2 ?F (36.8 ?C)   Resp 18   SpO2 99%  ? ?Visual Acuity ?Right Eye Distance:   ?Left Eye Distance:   ?Bilateral Distance:   ? ?Right Eye Near:   ?Left Eye Near:    ?Bilateral Near:    ? ?Physical Exam ?Vitals and nursing note reviewed.  ?Constitutional:   ?   General: She is not in acute distress. ?   Appearance: Normal appearance. She is well-developed. She is not ill-appearing.  ?HENT:  ?   Right Ear: There  is impacted cerumen.  ?   Left Ear: Tympanic membrane normal. There is impacted cerumen.  ?   Ears:  ?   Comments: Unable to visualize right TM due to cerumen.  Less cerumen in left canal  and TM noted to be clear.  ?   Nose: Nose normal.  ?   Mouth/Throat:  ?   Mouth: Mucous membranes are moist.  ?   Pharynx: Oropharynx is clear.  ?   Comments: Scant amount of white exudate on posterior tongue.  ?Cardiovascular:  ?   Rate and Rhythm: Normal rate and regular rhythm.  ?   Heart sounds: Normal heart sounds.  ?Pulmonary:  ?   Effort: Pulmonary effort is normal. No respiratory distress.  ?   Breath sounds: Normal breath sounds.  ?Musculoskeletal:  ?   Cervical back: Neck supple.  ?Skin: ?   General: Skin is warm and dry.  ?Neurological:  ?   Mental Status: She is alert.  ?Psychiatric:     ?   Mood and Affect: Mood normal.     ?   Behavior: Behavior normal.  ? ? ? ?UC Treatments / Results  ?Labs ?(all labs ordered are listed, but only abnormal results are displayed) ?Labs Reviewed - No data to display ? ?EKG ? ? ?Radiology ?No results found. ? ?Procedures ?Procedures (including critical care time) ? ?Medications Ordered in UC ?Medications - No data to display ? ?Initial Impression / Assessment and Plan / UC Course  ?I have reviewed the triage vital signs and the nursing notes. ? ?Pertinent labs & imaging results that were available during my care of the patient were reviewed by me and considered in my medical decision making (see chart for details). ? ?Bilateral impacted cerumen, otalgia, oral thrush.  Cerumen removed via irrigation by RN.  Discussed guaifenesin and ibuprofen for otalgia for possible eustachian tube dysfunction following her recent sinusitis.  Instructed patient to follow-up with her PCP or an ENT if her ear symptoms are not improving.  Treating oral thrush with nystatin oral suspension.  Instructed patient to follow up with her PCP if her symptoms are not improving.  She agrees to plan of care.   ? ? ? ?Final Clinical Impressions(s) / UC Diagnoses  ? ?Final diagnoses:  ?Bilateral impacted cerumen  ?Thrush, oral  ?Otalgia of both ears  ? ? ? ?Discharge Instructions   ? ?  ?Use the Nystatin oral suspension as dire

## 2021-11-08 NOTE — Discharge Instructions (Addendum)
Use the Nystatin oral suspension as directed for 7 days.  Follow up with your primary care provider if your symptoms are not improving.   ? ?Your ear wax was removed today.  Follow up with your primary care provider or ENT if your symptoms are not improving.   ? ?

## 2021-11-18 ENCOUNTER — Other Ambulatory Visit: Payer: Self-pay | Admitting: Primary Care

## 2021-11-18 DIAGNOSIS — F411 Generalized anxiety disorder: Secondary | ICD-10-CM

## 2021-11-20 NOTE — Telephone Encounter (Signed)
I spoke with pt; pt said on 10/24/21 given abx for sinusitis and pt finished med around 11/03/21. Pt said starting on 11/13/21 had mostly burning sensation in perineal area but some vaginal burning and slight vaginal discharge but pt did not pay attention to color of discharge. Pt said she started OTC Monistat 3 day treatment on 11/16/21 and was somewhat effective but pt still has some perineal burning and pt thinks needs med to clear up yeast infection. Pt has never taken prescription med for yeast infection before. [facility] and if med sent to pharmacy pt said the pharmacy will notify pt. Sending note to Gentry Fitz NP. ?

## 2021-12-03 ENCOUNTER — Ambulatory Visit (INDEPENDENT_AMBULATORY_CARE_PROVIDER_SITE_OTHER): Payer: PPO | Admitting: Family

## 2021-12-03 ENCOUNTER — Encounter: Payer: Self-pay | Admitting: Family

## 2021-12-03 VITALS — BP 124/78 | HR 79 | Temp 98.4°F | Resp 16 | Ht 63.0 in | Wt 102.1 lb

## 2021-12-03 DIAGNOSIS — B3731 Acute candidiasis of vulva and vagina: Secondary | ICD-10-CM | POA: Diagnosis not present

## 2021-12-03 DIAGNOSIS — J301 Allergic rhinitis due to pollen: Secondary | ICD-10-CM

## 2021-12-03 DIAGNOSIS — J329 Chronic sinusitis, unspecified: Secondary | ICD-10-CM | POA: Diagnosis not present

## 2021-12-03 DIAGNOSIS — R208 Other disturbances of skin sensation: Secondary | ICD-10-CM | POA: Diagnosis not present

## 2021-12-03 DIAGNOSIS — J310 Chronic rhinitis: Secondary | ICD-10-CM

## 2021-12-03 DIAGNOSIS — F418 Other specified anxiety disorders: Secondary | ICD-10-CM

## 2021-12-03 MED ORDER — LIDOCAINE VISCOUS HCL 2 % MT SOLN: 5.0000 mL | Freq: Four times a day (QID) | OROMUCOSAL | 0 refills | Status: AC

## 2021-12-03 MED ORDER — FLUTICASONE PROPIONATE 50 MCG/ACT NA SUSP: NASAL | 3 refills | Status: DC

## 2021-12-03 MED ORDER — MIRTAZAPINE 15 MG PO TBDP: 15.0000 mg | ORAL_TABLET | Freq: Every day | ORAL | 1 refills | Status: DC

## 2021-12-03 MED ORDER — LORATADINE 10 MG PO TABS: 10.0000 mg | ORAL_TABLET | Freq: Every day | ORAL | 11 refills | Status: DC

## 2021-12-03 MED ORDER — FLUCONAZOLE 150 MG PO TABS: ORAL_TABLET | ORAL | 0 refills | Status: DC

## 2021-12-03 NOTE — Patient Instructions (Signed)
Stop trazodone for now.  ?Start remeron 15 mg once nightly.  ? ?Take diflucan for yeast symptoms (thrush and vaginal) ? ?Start daily flonase and claritin, sent in RX but may not be covered by insurance.  ? ?Sent in Mouthwash to use as directed for burning sensation in mouth.  ? ?Please work on the following to help with sleep: ? ?-Sleep only long enough to feel rested then get out of bed ?-Go to bed and get up at the same time every day. ?-Do not try to force yourself to sleep. If you can't sleep, get out of bed adn try again later. ?-Have coffee, tea, and other foods that have caffeine only in the morning. ?-Avoid alcohol ?-Keep your bedroom dark, cool, quiet, and free of reminders of work or other things that cause you stress ?-Exercise several days a week, but not right before bed ?-Avoid looking at phones or reading devices ("e-books") that give off light before bed. This can make it harder to fall asleep ? ?A referral was placed today for ENT for suspected chronic sinusitis. ?Please let us know if you have not heard back within 2 weeks about the referral. ? ?I also recommend maybe seeking therapy/counseling to help with your stressors and to allow for a release to discuss your concerns.  ? ?Due to recent changes in healthcare laws, you may see results of your imaging and/or laboratory studies on MyChart before I have had a chance to review them.  I understand that in some cases there may be results that are confusing or concerning to you. Please understand that not all results are received at the same time and often I may need to interpret multiple results in order to provide you with the best plan of care or course of treatment. Therefore, I ask that you please give me 2 business days to thoroughly review all your results before contacting my office for clarification. Should we see a critical lab result, you will be contacted sooner.  ? ?It was a pleasure seeing you today! Please do not hesitate to reach out  with any questions and or concerns. ? ?Regards,  ? ?Yulanda Diggs ?FNP-C ? ? ?

## 2021-12-03 NOTE — Assessment & Plan Note (Addendum)
Referral placed for ENT as this is ongoing chronic rhinosinusitis with treatment failure.  Patient also with ongoing left otalgia with no physical examination concerns ? ?Did advise patient to resume Flonase and Claritin ?

## 2021-12-03 NOTE — Assessment & Plan Note (Signed)
Rx Diflucan 150 mg ?

## 2021-12-03 NOTE — Assessment & Plan Note (Signed)
Advised patient to stop trazodone and start Remeron 15 mg at night.  I feel this will help with her emotional as well as sleep.  Patient to follow-up in 1 month with her primary care for ongoing follow-up.  Also recommended that she start seeing a therapist which might help with her situation. ?

## 2021-12-03 NOTE — Assessment & Plan Note (Signed)
Does not appear to be thrush anymore however giving Diflucan for vaginal Candida.  Magic mouthwash to help with the burning sensation. ?

## 2021-12-03 NOTE — Progress Notes (Signed)
? ?Established Patient Office Visit ? ?Subjective:  ?Patient ID: Sandra [name], female    DOB: [birth year]  Age: 71 y.o. MRN: [id number] ? ?CC:  ?Chief Complaint  ?Patient presents with  ? Thrush  ?  X 3.5 weeks  ? Ear Fullness  ?  X 3.5  ? ? ?HPI ?Sandra [name] is here today for follow up from the urgent care.  ? ?Seen via video visit 3/10 dx with sinusitis given augmentin and medrol dose pack. Had slight improvement and symptom improvement for about a week but then started again with bil ear fullness and mouth pain. Went to urgent care and was treated for thrush with nystatin oral suspension for 7 days. She did take this with some improvement but thinks it is now back.  ? ?Also had bil impacted cerumen and irrigation was performed at urgent care.  ? ?Today she is with irritation and burning on her tongue and behind her lips.  ?Still with some left sided ear fullness/pressure where she has to apply heat to ear at night to have some relief. Some pressure still in preauricular area, no sore throat, no issues with swallowing. No cough.  ? ?Taking mucinex and also sudafed which helped her some.  ? ?Did have some vaginal itching as well, used monistat over the counter with mild relief but still with some irritation.  ? ?Anxiety/depression: Patient still finding it hard to sleep and go to sleep at night.  Trazodone does not really help her.  She has a lot of stressors and a lot of anxiety and she finds that she has a lot emotional bouts of crying.  She is stressed over family situation as well and her son with his health. ? ?Past Medical History:  ?Diagnosis Date  ? COVID-19 virus infection 09/26/2021  ? Hypertension   ? Thyroid disease   ? ? ?Past Surgical History:  ?Procedure Laterality Date  ? CESAREAN SECTION    ? X 2  ? COLONOSCOPY WITH PROPOFOL N/A 07/23/2020  ? Procedure: COLONOSCOPY WITH PROPOFOL;  Surgeon: Virgel Manifold, MD;  Location: [facility];  Service: Endoscopy;  Laterality: N/A;   ? ? ?Family History  ?Problem Relation Age of Onset  ? Arthritis Mother   ?     s/p hip replacement  ? Hyperlipidemia Mother   ? Heart disease Mother   ? Hypertension Mother   ? Arthritis Father   ? Hyperlipidemia Father   ? Heart disease Father   ? Hypertension Father   ? Dementia Father   ? Thrombocytopenia Son   ? Hearing loss Son   ? Thrombocytopenia Son   ? ? ?Social History  ? ?Socioeconomic History  ? Marital status: Married  ?  Spouse name: Not on file  ? Number of children: Not on file  ? Years of education: Not on file  ? Highest education level: Not on file  ?Occupational History  ? Not on file  ?Tobacco Use  ? Smoking status: Never  ? Smokeless tobacco: Never  ?Vaping Use  ? Vaping Use: Never used  ?Substance and Sexual Activity  ? Alcohol use: Yes  ? Drug use: No  ? Sexual activity: Not on file  ?Other Topics Concern  ? Not on file  ?Social History Narrative  ? Married.  ? 2 children, no grandchildren.  ? 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 Diet - regular  ? Exercise - gym 4x per week  ? Enjoys playing  with her dog, walking, exercising.   ? Has a living will  ? ?Social Determinants of Health  ? ?Financial Resource Strain: Not on file  ?Food Insecurity: Not on file  ?Transportation Needs: Not on file  ?Physical Activity: Not on file  ?Stress: Not on file  ?Social Connections: Not on file  ?Intimate Partner Violence: Not on file  ? ? ?Outpatient Medications Prior to Visit  ?Medication Sig Dispense Refill  ? calcium-vitamin D (OSCAL WITH D) 500-200 MG-UNIT tablet Take 2 tablets by mouth daily.    ? estradiol (ESTRACE) 0.1 MG/GM vaginal cream INSERT 1 GRAM 1 TO 3 TIMES WEEKLY 42.5 g 1  ? finasteride (PROSCAR) 5 MG tablet finasteride 5 mg tablet    ? hydrOXYzine (ATARAX) 10 MG tablet TAKE 1 TABLET BY MOUTH 2 TIMES DAILY AS NEEDED FOR ANXIETY. 60 tablet 0  ? levothyroxine (SYNTHROID) 112 MCG tablet Take 1 tablet by mouth every morning on an empty stomach with water only.  No food or  other medications for 30 minutes. 90 tablet 3  ? losartan (COZAAR) 50 MG tablet Take 1 tablet (50 mg total) by mouth daily. For blood pressure. 90 tablet 3  ? Melatonin 5 MG TABS Take 5 mg by mouth at bedtime as needed.     ? meloxicam (MOBIC) 15 MG tablet TAKE 1 TABLET BY MOUTH EVERY DAY AS NEEDED FOR PAIN. TAKE WITH FOOD. 90 tablet 3  ? fluticasone (FLONASE) 50 MCG/ACT nasal spray USE ONE TO TWO SPRAYS IN EACH NOSTRIL EVERYDAY IF NEEDED 16 g 3  ? traZODone (DESYREL) 150 MG tablet TAKE 1 TABLET BY MOUTH NIGHTLY AS NEEDEDFOR INSOMNIA. Office visit required for further refills. 90 tablet 0  ? minoxidil (LONITEN) 2.5 MG tablet Take 1.25 mg by mouth daily.    ? nystatin (MYCOSTATIN) 100000 UNIT/ML suspension Take 5 mLs (500,000 Units total) by mouth 4 (four) times daily. (Patient not taking: Reported on 12/03/2021) 140 mL 0  ? amoxicillin-clavulanate (AUGMENTIN) 875-125 MG tablet Take 1 tablet by mouth 2 (two) times daily. (Patient not taking: Reported on 12/03/2021) 20 tablet 0  ? dicyclomine (BENTYL) 10 MG capsule Take 1 capsule (10 mg total) by mouth 3 (three) times daily before meals. As needed for gas and loose stools. (Patient not taking: Reported on 12/03/2021) 30 capsule 0  ? methylPREDNISolone (MEDROL DOSEPAK) 4 MG TBPK tablet Take per package instructions (Patient not taking: Reported on 12/03/2021) 21 tablet 0  ? ?No facility-administered medications prior to visit.  ? ? ?No Known Allergies ? ?ROS ?Review of Systems  ?Constitutional:  Positive for fatigue. Negative for chills and fever.  ?HENT:  Positive for ear pain (slight ear pressure) and postnasal drip. Negative for congestion, sinus pressure and sore throat.   ?     Tongue burning sensation  ?Respiratory:  Negative for cough, shortness of breath and wheezing.   ?Cardiovascular:  Negative for chest pain and palpitations.  ?Genitourinary:  Negative for difficulty urinating, frequency, pelvic pain, urgency and vaginal discharge. Vaginal pain: vaginal  itching. ?Neurological:  Negative for light-headedness.  ?Psychiatric/Behavioral:  Positive for sleep disturbance. Negative for agitation, self-injury and suicidal ideas. The patient is nervous/anxious.   ? ? ?  ?Objective:  ?  ?Physical Exam ?Constitutional:   ?   General: She is not in acute distress. ?   Appearance: Normal appearance. She is normal weight. She is not ill-appearing, toxic-appearing or diaphoretic.  ?HENT:  ?   Head: Normocephalic.  ?   Right Ear: Tympanic  membrane normal.  ?   Left Ear: Tympanic membrane normal.  ?   Nose: Nose normal.  ?   Mouth/Throat:  ?   Mouth: Mucous membranes are moist.  ?Eyes:  ?   Pupils: Pupils are equal, round, and reactive to light.  ?Cardiovascular:  ?   Rate and Rhythm: Normal rate and regular rhythm.  ?Pulmonary:  ?   Effort: Pulmonary effort is normal.  ?Neurological:  ?   Mental Status: She is alert.  ?Psychiatric:     ?   Mood and Affect: Mood is anxious and depressed. Affect is tearful.     ?   Thought Content: Thought content does not include suicidal ideation. Thought content does not include suicidal plan.  ? ? ? ? ?BP 124/78   Pulse 79   Temp 98.4 ?F (36.9 ?C)   Resp 16   Ht '5\' 3"'$  (1.6 m)   Wt 102 lb 1 oz (46.3 kg)   SpO2 98%   BMI 18.08 kg/m?  ?Wt Readings from Last 3 Encounters:  ?12/03/21 102 lb 1 oz (46.3 kg)  ?10/02/21 100 lb (45.4 kg)  ?09/26/21 104 lb (47.2 kg)  ? ? ? ?There are no preventive care reminders to display for this patient. ? ?There are no preventive care reminders to display for this patient. ? ?Lab Results  ?Component Value Date  ? TSH 2.51 10/02/2021  ? ?Lab Results  ?Component Value Date  ? WBC 3.4 (L) 10/02/2021  ? HGB 13.1 10/02/2021  ? HCT 37.7 10/02/2021  ? MCV 91.5 10/02/2021  ? PLT 156.0 10/02/2021  ? ?Lab Results  ?Component Value Date  ? NA 132 (L) 10/02/2021  ? K 4.6 10/02/2021  ? CO2 32 10/02/2021  ? GLUCOSE 85 10/02/2021  ? BUN 14 10/02/2021  ? CREATININE 0.69 10/02/2021  ? BILITOT 0.7 10/02/2021  ? ALKPHOS 55  10/02/2021  ? AST 21 10/02/2021  ? ALT 18 10/02/2021  ? PROT 6.9 10/02/2021  ? ALBUMIN 4.7 10/02/2021  ? CALCIUM 9.5 10/02/2021  ? ANIONGAP 10 08/09/2019  ? GFR 88.55 10/02/2021  ? ?Lab Results  ?Component Value Date  ? CHOL 229 (H) 0

## 2021-12-10 ENCOUNTER — Telehealth: Payer: Self-pay | Admitting: Primary Care

## 2021-12-10 NOTE — Telephone Encounter (Signed)
Called and spoke with patient she was told come back in 3 weeks to have her sodium level recheck , made an appt for patient to come in 05/02//23 to come in to have this done. ?

## 2021-12-10 NOTE — Telephone Encounter (Signed)
Pt called asking about checking her sodium levels.  ?

## 2021-12-15 ENCOUNTER — Encounter: Payer: Self-pay | Admitting: *Deleted

## 2021-12-16 ENCOUNTER — Other Ambulatory Visit (INDEPENDENT_AMBULATORY_CARE_PROVIDER_SITE_OTHER): Payer: PPO

## 2021-12-16 DIAGNOSIS — E871 Hypo-osmolality and hyponatremia: Secondary | ICD-10-CM

## 2021-12-16 LAB — BASIC METABOLIC PANEL
BUN: 19 mg/dL (ref 6–23)
CO2: 28 mEq/L (ref 19–32)
Calcium: 9.2 mg/dL (ref 8.4–10.5)
Chloride: 99 mEq/L (ref 96–112)
Creatinine, Ser: 0.95 mg/dL (ref 0.40–1.20)
GFR: 61.08 mL/min (ref >60.00)
Glucose, Bld: 91 mg/dL (ref 70–99)
Potassium: 4.1 mEq/L (ref 3.5–5.1)
Sodium: 134 mEq/L — ABNORMAL LOW (ref 135–145)

## 2021-12-26 DIAGNOSIS — D2272 Melanocytic nevi of left lower limb, including hip: Secondary | ICD-10-CM | POA: Diagnosis not present

## 2021-12-26 DIAGNOSIS — D2261 Melanocytic nevi of right upper limb, including shoulder: Secondary | ICD-10-CM | POA: Diagnosis not present

## 2021-12-26 DIAGNOSIS — D2271 Melanocytic nevi of right lower limb, including hip: Secondary | ICD-10-CM | POA: Diagnosis not present

## 2021-12-26 DIAGNOSIS — Z85828 Personal history of other malignant neoplasm of skin: Secondary | ICD-10-CM | POA: Diagnosis not present

## 2021-12-26 DIAGNOSIS — D2262 Melanocytic nevi of left upper limb, including shoulder: Secondary | ICD-10-CM | POA: Diagnosis not present

## 2022-01-06 ENCOUNTER — Other Ambulatory Visit: Payer: Self-pay | Admitting: Family

## 2022-01-06 DIAGNOSIS — F418 Other specified anxiety disorders: Secondary | ICD-10-CM

## 2022-01-09 ENCOUNTER — Ambulatory Visit (INDEPENDENT_AMBULATORY_CARE_PROVIDER_SITE_OTHER): Payer: PPO | Admitting: Primary Care

## 2022-01-09 ENCOUNTER — Encounter: Payer: Self-pay | Admitting: Primary Care

## 2022-01-09 DIAGNOSIS — F418 Other specified anxiety disorders: Secondary | ICD-10-CM

## 2022-01-09 NOTE — Patient Instructions (Signed)
Continue mirtazipine (Remeron) at bedtime for sleep.  Use the hydroxyzine as needed for anxiety.  It was a pleasure to see you today!

## 2022-01-09 NOTE — Assessment & Plan Note (Signed)
Improved.  Continue mirtazapine 15 mg HS. Continue hydroxyzine 10 mg PRN.  Continue to monitor.

## 2022-01-09 NOTE — Progress Notes (Signed)
Subjective:    Patient ID: Sandra [name], female    DOB: October 16, 1950, 71 y.o.   MRN: [id number]  HPI  Sandra [name] is a very pleasant 71 y.o. female with a history of hypertension, hypothyroidism, arthritis, GAD, insomnia, chronic fatigue who presents today   Evaluated by Cassandria Santee, NP on 12/03/2021 for several concerns.  She discussed symptoms of difficulty falling asleep at night, no improvement with trazodone.  She also mentioned continued stressors and a lot of anxiety, tearfulness, worrying about her son.  During this visit she was advised to discontinue trazodone, start Remeron 15 mg at bedtime, follow-up with PCP.   She was last evaluated by me on 10/02/2021 for symptoms of chronic fatigue, anxiety.  She declined SSRI treatment at the time, requested to continue hydroxyzine 10 mg as needed.  Today she endorses sleeping better. She has less difficulty falling asleep, is still waking during the night but falls back asleep shortly. She has noticed improvement in her anxiety, has not required use of her hydroxyzine. She continues to worry about her son but is able to handle her stress better. She has noticed an increase in her appetite.     Review of Systems  Gastrointestinal:  Negative for nausea.  Neurological:  Negative for dizziness.  Psychiatric/Behavioral:  Positive for sleep disturbance. The patient is nervous/anxious.         Past Medical History:  Diagnosis Date   COVID-19 virus infection 09/26/2021   Hypertension    Thyroid disease     Social History   Socioeconomic History   Marital status: Married    Spouse name: Not on file   Number of children: Not on file   Years of education: Not on file   Highest education level: Not on file  Occupational History   Not on file  Tobacco Use   Smoking status: Never   Smokeless tobacco: Never  Vaping Use   Vaping Use: Never used  Substance and Sexual Activity   Alcohol use: Yes   Drug use: No   Sexual activity:  Not on file  Other Topics Concern   Not on file  Social History Narrative   Married.   2 children, no grandchildren.   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    Has a living will   Social Determinants of Health   Financial Resource Strain: Not on file  Food Insecurity: Not on file  Transportation Needs: Not on file  Physical Activity: Not on file  Stress: Not on file  Social Connections: Not on file  Intimate Partner Violence: Not on file    Past Surgical History:  Procedure Laterality Date   CESAREAN SECTION     X 2   COLONOSCOPY WITH PROPOFOL N/A 07/23/2020   Procedure: COLONOSCOPY WITH PROPOFOL;  Surgeon: Virgel Manifold, MD;  Location: [facility] ENDOSCOPY;  Service: Endoscopy;  Laterality: N/A;    Family History  Problem Relation Age of Onset   Arthritis Mother        s/p hip replacement   Hyperlipidemia Mother    Heart disease Mother    Hypertension Mother    Arthritis Father    Hyperlipidemia Father    Heart disease Father    Hypertension Father    Dementia Father    Thrombocytopenia Son    Hearing loss Son  Thrombocytopenia Son     No Known Allergies  Current Outpatient Medications on File Prior to Visit  Medication Sig Dispense Refill   calcium-vitamin D (OSCAL WITH D) 500-200 MG-UNIT tablet Take 2 tablets by mouth daily.     estradiol (ESTRACE) 0.1 MG/GM vaginal cream INSERT 1 GRAM 1 TO 3 TIMES WEEKLY 42.5 g 1   finasteride (PROSCAR) 5 MG tablet finasteride 5 mg tablet     fluticasone (FLONASE) 50 MCG/ACT nasal spray USE ONE TO TWO SPRAYS IN EACH NOSTRIL EVERYDAY IF NEEDED 16 g 3   levothyroxine (SYNTHROID) 112 MCG tablet Take 1 tablet by mouth every morning on an empty stomach with water only.  No food or other medications for 30 minutes. 90 tablet 3   loratadine (CLARITIN) 10 MG tablet Take 1 tablet (10 mg total) by mouth daily. 30  tablet 11   losartan (COZAAR) 50 MG tablet Take 1 tablet (50 mg total) by mouth daily. For blood pressure. 90 tablet 3   meloxicam (MOBIC) 15 MG tablet TAKE 1 TABLET BY MOUTH EVERY DAY AS NEEDED FOR PAIN. TAKE WITH FOOD. 90 tablet 3   minoxidil (LONITEN) 2.5 MG tablet Take 1.25 mg by mouth daily.     mirtazapine (REMERON SOL-TAB) 15 MG disintegrating tablet Take 1 tablet (15 mg total) by mouth at bedtime. 30 tablet 1   No current facility-administered medications on file prior to visit.    BP 120/70   Pulse 98   Ht '5\' 3"'$  (1.6 m)   Wt 106 lb (48.1 kg)   SpO2 99%   BMI 18.78 kg/m  Objective:   Physical Exam Cardiovascular:     Rate and Rhythm: Normal rate and regular rhythm.  Pulmonary:     Effort: Pulmonary effort is normal.     Breath sounds: Normal breath sounds.  Musculoskeletal:     Cervical back: Neck supple.  Skin:    General: Skin is warm and dry.  Psychiatric:        Mood and Affect: Mood normal.          Assessment & Plan:

## 2022-03-04 ENCOUNTER — Telehealth: Payer: Self-pay | Admitting: Primary Care

## 2022-03-04 NOTE — Telephone Encounter (Signed)
LVM for pt to rtn my call to schedule AWV with [facility] call back # [phone] 

## 2022-03-16 DIAGNOSIS — M170 Bilateral primary osteoarthritis of knee: Secondary | ICD-10-CM | POA: Diagnosis not present

## 2022-03-16 DIAGNOSIS — M1711 Unilateral primary osteoarthritis, right knee: Secondary | ICD-10-CM | POA: Diagnosis not present

## 2022-03-16 DIAGNOSIS — M1712 Unilateral primary osteoarthritis, left knee: Secondary | ICD-10-CM | POA: Diagnosis not present

## 2022-04-15 ENCOUNTER — Telehealth: Payer: Self-pay | Admitting: Primary Care

## 2022-04-15 NOTE — Telephone Encounter (Signed)
Patient called in stating that [facility] mammography said that they have sent over a fax 3 times for the approval for her bone density test and mammogram,but still hasn't heard back.

## 2022-04-15 NOTE — Telephone Encounter (Signed)
Tried [employer], no answer. Re-faxed and received confirmation. Advised patient to reach to them to schedule or contact us directly.

## 2022-04-16 DIAGNOSIS — Z780 Asymptomatic menopausal state: Secondary | ICD-10-CM | POA: Diagnosis not present

## 2022-04-16 DIAGNOSIS — Z1231 Encounter for screening mammogram for malignant neoplasm of breast: Secondary | ICD-10-CM | POA: Diagnosis not present

## 2022-04-16 DIAGNOSIS — M8589 Other specified disorders of bone density and structure, multiple sites: Secondary | ICD-10-CM | POA: Diagnosis not present

## 2022-04-16 LAB — HM MAMMOGRAPHY

## 2022-04-16 LAB — HM DEXA SCAN

## 2022-04-21 ENCOUNTER — Encounter: Payer: Self-pay | Admitting: Primary Care

## 2022-05-06 DIAGNOSIS — M170 Bilateral primary osteoarthritis of knee: Secondary | ICD-10-CM | POA: Diagnosis not present

## 2022-05-15 DIAGNOSIS — M170 Bilateral primary osteoarthritis of knee: Secondary | ICD-10-CM | POA: Diagnosis not present

## 2022-06-15 ENCOUNTER — Ambulatory Visit (INDEPENDENT_AMBULATORY_CARE_PROVIDER_SITE_OTHER): Payer: PPO

## 2022-06-15 VITALS — Ht 64.0 in | Wt 110.0 lb

## 2022-06-15 DIAGNOSIS — Z0000 Encounter for general adult medical examination without abnormal findings: Secondary | ICD-10-CM

## 2022-06-15 NOTE — Patient Instructions (Signed)
Ms. Bitner , Thank you for taking time to come for your Medicare Wellness Visit. I appreciate your ongoing commitment to your health goals. Please review the following plan we discussed and let me know if I can assist you in the future.   Screening recommendations/referrals: Colonoscopy: completed 07/23/2020, due 07/23/2030 Mammogram: completed 04/16/2022, due 04/18/2023 Bone Density: completed 04/16/2022 Recommended yearly ophthalmology/optometry visit for glaucoma screening and checkup Recommended yearly dental visit for hygiene and checkup  Vaccinations: Influenza vaccine: due Pneumococcal vaccine: completed 05/24/2019 Tdap vaccine: completed 04/16/2016, due 04/16/2026 Shingles vaccine: due   Covid-19: 10/19/2019, 09/19/2019  Advanced directives: Please bring a copy of your POA (Power of Attorney) and/or Living Will to your next appointment.   Conditions/risks identified: none  Next appointment: Follow up in one year for your annual wellness visit    Preventive Care 65 Years and Older, Female Preventive care refers to lifestyle choices and visits with your health care provider that can promote health and wellness. What does preventive care include? A yearly physical exam. This is also called an annual well check. Dental exams once or twice a year. Routine eye exams. Ask your health care provider how often you should have your eyes checked. Personal lifestyle choices, including: Daily care of your teeth and gums. Regular physical activity. Eating a healthy diet. Avoiding tobacco and drug use. Limiting alcohol use. Practicing safe sex. Taking low-dose aspirin every day. Taking vitamin and mineral supplements as recommended by your health care provider. What happens during an annual well check? The services and screenings done by your health care provider during your annual well check will depend on your age, overall health, lifestyle risk factors, and family history of  disease. Counseling  Your health care provider may ask you questions about your: Alcohol use. Tobacco use. Drug use. Emotional well-being. Home and relationship well-being. Sexual activity. Eating habits. History of falls. Memory and ability to understand (cognition). Work and work Statistician. Reproductive health. Screening  You may have the following tests or measurements: Height, weight, and BMI. Blood pressure. Lipid and cholesterol levels. These may be checked every 5 years, or more frequently if you are over 91 years old. Skin check. Lung cancer screening. You may have this screening every year starting at age 38 if you have a 30-pack-year history of smoking and currently smoke or have quit within the past 15 years. Fecal occult blood test (FOBT) of the stool. You may have this test every year starting at age 34. Flexible sigmoidoscopy or colonoscopy. You may have a sigmoidoscopy every 5 years or a colonoscopy every 10 years starting at age 105. Hepatitis C blood test. Hepatitis B blood test. Sexually transmitted disease (STD) testing. Diabetes screening. This is done by checking your blood sugar (glucose) after you have not eaten for a while (fasting). You may have this done every 1-3 years. Bone density scan. This is done to screen for osteoporosis. You may have this done starting at age 35. Mammogram. This may be done every 1-2 years. Talk to your health care provider about how often you should have regular mammograms. Talk with your health care provider about your test results, treatment options, and if necessary, the need for more tests. Vaccines  Your health care provider may recommend certain vaccines, such as: Influenza vaccine. This is recommended every year. Tetanus, diphtheria, and acellular pertussis (Tdap, Td) vaccine. You may need a Td booster every 10 years. Zoster vaccine. You may need this after age 48. Pneumococcal 13-valent conjugate (PCV13) vaccine.  One  dose is recommended after age 6. Pneumococcal polysaccharide (PPSV23) vaccine. One dose is recommended after age 77. Talk to your health care provider about which screenings and vaccines you need and how often you need them. This information is not intended to replace advice given to you by your health care provider. Make sure you discuss any questions you have with your health care provider. Document Released: 08/30/2015 Document Revised: 04/22/2016 Document Reviewed: 06/04/2015 Elsevier Interactive Patient Education  2017 [place] Prevention in the Home Falls can cause injuries. They can happen to people of all ages. There are many things you can do to make your home safe and to help prevent falls. What can I do on the outside of my home? Regularly fix the edges of walkways and driveways and fix any cracks. Remove anything that might make you trip as you walk through a door, such as a raised step or threshold. Trim any bushes or trees on the path to your home. Use bright outdoor lighting. Clear any walking paths of anything that might make someone trip, such as rocks or tools. Regularly check to see if handrails are loose or broken. Make sure that both sides of any steps have handrails. Any raised decks and porches should have guardrails on the edges. Have any leaves, snow, or ice cleared regularly. Use sand or salt on walking paths during winter. Clean up any spills in your garage right away. This includes oil or grease spills. What can I do in the bathroom? Use night lights. Install grab bars by the toilet and in the tub and shower. Do not use towel bars as grab bars. Use non-skid mats or decals in the tub or shower. If you need to sit down in the shower, use a plastic, non-slip stool. Keep the floor dry. Clean up any water that spills on the floor as soon as it happens. Remove soap buildup in the tub or shower regularly. Attach bath mats securely with double-sided  non-slip rug tape. Do not have throw rugs and other things on the floor that can make you trip. What can I do in the bedroom? Use night lights. Make sure that you have a light by your bed that is easy to reach. Do not use any sheets or blankets that are too big for your bed. They should not hang down onto the floor. Have a firm chair that has side arms. You can use this for support while you get dressed. Do not have throw rugs and other things on the floor that can make you trip. What can I do in the kitchen? Clean up any spills right away. Avoid walking on wet floors. Keep items that you use a lot in easy-to-reach places. If you need to reach something above you, use a strong step stool that has a grab bar. Keep electrical cords out of the way. Do not use floor polish or wax that makes floors slippery. If you must use wax, use non-skid floor wax. Do not have throw rugs and other things on the floor that can make you trip. What can I do with my stairs? Do not leave any items on the stairs. Make sure that there are handrails on both sides of the stairs and use them. Fix handrails that are broken or loose. Make sure that handrails are as long as the stairways. Check any carpeting to make sure that it is firmly attached to the stairs. Fix any carpet that is loose or  worn. Avoid having throw rugs at the top or bottom of the stairs. If you do have throw rugs, attach them to the floor with carpet tape. Make sure that you have a light switch at the top of the stairs and the bottom of the stairs. If you do not have them, ask someone to add them for you. What else can I do to help prevent falls? Wear shoes that: Do not have high heels. Have rubber bottoms. Are comfortable and fit you well. Are closed at the toe. Do not wear sandals. If you use a stepladder: Make sure that it is fully opened. Do not climb a closed stepladder. Make sure that both sides of the stepladder are locked into place. Ask  someone to hold it for you, if possible. Clearly mark and make sure that you can see: Any grab bars or handrails. First and last steps. Where the edge of each step is. Use tools that help you move around (mobility aids) if they are needed. These include: Canes. Walkers. Scooters. Crutches. Turn on the lights when you go into a dark area. Replace any light bulbs as soon as they burn out. Set up your furniture so you have a clear path. Avoid moving your furniture around. If any of your floors are uneven, fix them. If there are any pets around you, be aware of where they are. Review your medicines with your doctor. Some medicines can make you feel dizzy. This can increase your chance of falling. Ask your doctor what other things that you can do to help prevent falls. This information is not intended to replace advice given to you by your health care provider. Make sure you discuss any questions you have with your health care provider. Document Released: 05/30/2009 Document Revised: 01/09/2016 Document Reviewed: 09/07/2014 Elsevier Interactive Patient Education  2017 [employer].

## 2022-06-15 NOTE — Progress Notes (Signed)
I connected with Romero Liner today by telephone and verified that I am speaking with the correct person using two identifiers. Location patient: home Location provider: work Persons participating in the virtual visit: Niah, Heinle LPN.   I discussed the limitations, risks, security and privacy concerns of performing an evaluation and management service by telephone and the availability of in person appointments. I also discussed with the patient that there may be a patient responsible charge related to this service. The patient expressed understanding and verbally consented to this telephonic visit.    Interactive audio and video telecommunications were attempted between this provider and patient, however failed, due to patient having technical difficulties OR patient did not have access to video capability.  We continued and completed visit with audio only.     Vital signs may be patient reported or missing.  Subjective:   Sandra [name] is a 71 y.o. female who presents for Medicare Annual (Subsequent) preventive examination.  Review of Systems     Cardiac Risk Factors include: advanced age (>3mn, >>45women);hypertension     Objective:    Today's Vitals   06/15/22 0941  Weight: 110 lb (49.9 kg)  Height: '5\' 4"'$  (1.626 m)   Body mass index is 18.88 kg/m.     06/15/2022    9:45 AM 07/23/2020    8:11 AM 05/18/2019    2:00 PM  Advanced Directives  Does Patient Have a Medical Advance Directive? Yes Yes Yes  Type of AParamedicof [place]Living will HHarlem HeightsLiving will [place]Living will  Copy of [place]in Chart? No - copy requested Yes - validated most recent copy scanned in chart (See row information) No - copy requested    Current Medications (verified) Outpatient Encounter Medications as of 06/15/2022  Medication Sig   calcium-vitamin D (OSCAL WITH D)  500-200 MG-UNIT tablet Take 2 tablets by mouth daily.   estradiol (ESTRACE) 0.1 MG/GM vaginal cream INSERT 1 GRAM 1 TO 3 TIMES WEEKLY   finasteride (PROSCAR) 5 MG tablet finasteride 5 mg tablet   fluticasone (FLONASE) 50 MCG/ACT nasal spray USE ONE TO TWO SPRAYS IN EACH NOSTRIL EVERYDAY IF NEEDED   levothyroxine (SYNTHROID) 112 MCG tablet Take 1 tablet by mouth every morning on an empty stomach with water only.  No food or other medications for 30 minutes.   loratadine (CLARITIN) 10 MG tablet Take 1 tablet (10 mg total) by mouth daily.   losartan (COZAAR) 50 MG tablet Take 1 tablet (50 mg total) by mouth daily. For blood pressure.   meloxicam (MOBIC) 15 MG tablet TAKE 1 TABLET BY MOUTH EVERY DAY AS NEEDED FOR PAIN. TAKE WITH FOOD.   minoxidil (LONITEN) 2.5 MG tablet Take 1.25 mg by mouth daily.   mirtazapine (REMERON SOL-TAB) 15 MG disintegrating tablet Take 1 tablet (15 mg total) by mouth at bedtime. For sleep.   No facility-administered encounter medications on file as of 06/15/2022.    Allergies (verified) Patient has no known allergies.   History: Past Medical History:  Diagnosis Date   COVID-19 virus infection 09/26/2021   Hypertension    Thyroid disease    Past Surgical History:  Procedure Laterality Date   CESAREAN SECTION     X 2   COLONOSCOPY WITH PROPOFOL N/A 07/23/2020   Procedure: COLONOSCOPY WITH PROPOFOL;  Surgeon: TVirgel Manifold MD;  Location: [facility] ENDOSCOPY;  Service: Endoscopy;  Laterality: N/A;   Family History  Problem  Relation Age of Onset   Arthritis Mother        s/p hip replacement   Hyperlipidemia Mother    Heart disease Mother    Hypertension Mother    Arthritis Father    Hyperlipidemia Father    Heart disease Father    Hypertension Father    Dementia Father    Thrombocytopenia Son    Hearing loss Son    Thrombocytopenia Son    Social History   Socioeconomic History   Marital status: Married    Spouse name: Not on file   Number of  children: Not on file   Years of education: Not on file   Highest education level: Not on file  Occupational History   Not on file  Tobacco Use   Smoking status: Never   Smokeless tobacco: Never  Vaping Use   Vaping Use: Never used  Substance and Sexual Activity   Alcohol use: Yes   Drug use: No   Sexual activity: Not on file  Other Topics Concern   Not on file  Social History Narrative   Married.   2 children, no grandchildren.   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    Has a living will   Social Determinants of Health   Financial Resource Strain: Low Risk  (06/15/2022)   Overall Financial Resource Strain (CARDIA)    Difficulty of Paying Living Expenses: Not hard at all  Food Insecurity: No Food Insecurity (06/15/2022)   Hunger Vital Sign    Worried About Running Out of Food in the Last Year: Never true    Ran Out of Food in the Last Year: Never true  Transportation Needs: No Transportation Needs (06/15/2022)   PRAPARE - Hydrologist (Medical): No    Lack of Transportation (Non-Medical): No  Physical Activity: Sufficiently Active (06/15/2022)   Exercise Vital Sign    Days of Exercise per Week: 6 days    Minutes of Exercise per Session: 30 min  Stress: No Stress Concern Present (06/15/2022)   [place]    Feeling of Stress : Not at all  Social Connections: Not on file    Tobacco Counseling Counseling given: Not Answered   Clinical Intake:  Pre-visit preparation completed: Yes  Pain : No/denies pain     Nutritional Status: BMI of 19-24  Normal Nutritional Risks: None Diabetes: No  How often do you need to have someone help you when you read instructions, pamphlets, or other written materials from your doctor or pharmacy?: 1 - Never What is the  last grade level you completed in school?: college  Diabetic? no  Interpreter Needed?: No  Information entered by :: NAllen LPN   Activities of Daily Living    06/15/2022    9:45 AM  In your present state of health, do you have any difficulty performing the following activities:  Hearing? 0  Vision? 0  Difficulty concentrating or making decisions? 0  Walking or climbing stairs? 1  Dressing or bathing? 0  Doing errands, shopping? 0  Preparing Food and eating ? N  Using the Toilet? N  In the past six months, have you accidently leaked urine? N  Do you have problems with loss of bowel control? N  Managing your Medications? N  Managing your Finances?  N  Housekeeping or managing your Housekeeping? N    Patient Care Team: Pleas Koch, NP as PCP - General (Internal Medicine) Ortho, [facility] (Specialist)  Indicate any recent Medical Services you may have received from other than [facility] providers in the past year (date may be approximate).     Assessment:   This is a routine wellness examination for [place].  Hearing/Vision screen Vision Screening - Comments:: Regular eye exams, [facility]  Dietary issues and exercise activities discussed: Current Exercise Habits: Home exercise routine, Type of exercise: walking, Time (Minutes): 30, Frequency (Times/Week): 6, Weekly Exercise (Minutes/Week): 180   Goals Addressed             This Visit's Progress    Patient Stated       06/15/2022, exercise and healthy eating       Depression Screen    06/15/2022    9:45 AM 09/02/2021   10:29 AM 06/21/2020    8:17 AM 05/18/2019    2:01 PM 04/28/2017    9:15 AM 04/09/2015    8:35 AM  PHQ 2/9 Scores  PHQ - 2 Score 0 0 0 0 0 1  PHQ- 9 Score  0 0 0    Exception Documentation     Patient refusal     Fall Risk    06/15/2022    9:45 AM 10/02/2021    2:09 PM 06/21/2020    8:17 AM 03/12/2020    1:32 PM 05/18/2019    2:01 PM  [place] in the past year? 0 0 0  0 0  Comment    Emmi Telephone Survey: data to providers prior to load   Number falls in past yr: 0 0 0    Injury with Fall? 0 0 0    Risk for fall due to : Medication side effect    Medication side effect  Follow up Falls prevention discussed;Education provided;Falls evaluation completed Falls evaluation completed   Falls evaluation completed;Falls prevention discussed    FALL RISK PREVENTION PERTAINING TO THE HOME:  Any stairs in or around the home? No  If so, are there any without handrails? No  Home free of loose throw rugs in walkways, pet beds, electrical cords, etc? Yes  Adequate lighting in your home to reduce risk of falls? Yes   ASSISTIVE DEVICES UTILIZED TO PREVENT FALLS:  Life alert? No  Use of a cane, walker or w/c? No  Grab bars in the bathroom? No  Shower chair or bench in shower? No  Elevated toilet seat or a handicapped toilet? No   TIMED UP AND GO:  Was the test performed? No .       Cognitive Function:    05/18/2019    2:03 PM  MMSE - Mini Mental State Exam  Orientation to time 5  Orientation to Place 5  Registration 3  Attention/ Calculation 5  Recall 3  Language- repeat 1        06/15/2022    9:46 AM  6CIT Screen  What Year? 0 points  What month? 0 points  What time? 0 points  Count back from 20 0 points  Months in reverse 0 points  Repeat phrase 0 points  Total Score 0 points    Immunizations Immunization History  Administered Date(s) Administered   Influenza Split 05/23/2012   Influenza Whole 05/31/2021   Influenza,inj,Quad PF,6+ Mos 05/24/2019   Influenza-Unspecified 05/17/2014, 05/17/2018, 06/05/2020   Moderna Sars-Covid-2 Vaccination 09/19/2019, 10/19/2019  Pneumococcal Conjugate-13 05/04/2018   Pneumococcal Polysaccharide-23 05/24/2019   Tdap 04/16/2016    TDAP status: Up to date  Flu Vaccine status: Due, Education has been provided regarding the importance of this vaccine. Advised may receive this vaccine at local  pharmacy or Health Dept. Aware to provide a copy of the vaccination record if obtained from local pharmacy or Health Dept. Verbalized acceptance and understanding.  Pneumococcal vaccine status: Up to date  Covid-19 vaccine status: Completed vaccines  Qualifies for Shingles Vaccine? Yes   Zostavax completed No   Shingrix Completed?: No.    Education has been provided regarding the importance of this vaccine. Patient has been advised to call insurance company to determine out of pocket expense if they have not yet received this vaccine. Advised may also receive vaccine at local pharmacy or Health Dept. Verbalized acceptance and understanding.  Screening Tests Health Maintenance  Topic Date Due   Zoster Vaccines- Shingrix (1 of 2) Never done   Medicare Annual Wellness (AWV)  05/17/2020   INFLUENZA VACCINE  03/17/2022   MAMMOGRAM  04/16/2024   COLONOSCOPY (Pts 45-36yr Insurance coverage will need to be confirmed)  07/23/2025   TETANUS/TDAP  04/16/2026   Pneumonia Vaccine 70 Years old  Completed   DEXA SCAN  Completed   Hepatitis C Screening  Completed   HPV VACCINES  Aged Out   COVID-19 Vaccine  Discontinued    Health Maintenance  Health Maintenance Due  Topic Date Due   Zoster Vaccines- Shingrix (1 of 2) Never done   Medicare Annual Wellness (AWV)  05/17/2020   INFLUENZA VACCINE  03/17/2022    Colorectal cancer screening: Type of screening: Colonoscopy. Completed 07/23/2020. Repeat every 10 years  Mammogram status: Completed 04/16/2022. Repeat every year  Bone Density status: Completed 04/16/2022.   Lung Cancer Screening: (Low Dose CT Chest recommended if Age 70-80years, 30 pack-year currently smoking OR have quit w/in 15years.) does not qualify.   Lung Cancer Screening Referral: no  Additional Screening:  Hepatitis C Screening: does qualify; Completed 04/21/2018  Vision Screening: Recommended annual ophthalmology exams for early detection of glaucoma and other disorders  of the eye. Is the patient up to date with their annual eye exam?  Yes  Who is the provider or what is the name of the [facility] which the patient attends annual [facility] exams[facility] provider, would they like to be referred to a provider to establish care? No .   Dental Screening: Recommended annual dental exams for proper oral hygiene  Community Resource Referral / Chronic Care Management: CRR required this visit?  No   CCM required this visit?  No      Plan:     I have personally reviewed and noted the following in the patient's chart:   Medical and social history Use of alcohol, tobacco or illicit drugs  Current medications and supplements including opioid prescriptions. Patient is not currently taking opioid prescriptions. Functional ability and status Nutritional status Physical activity Advanced directives List of other physicians Hospitalizations, surgeries, and ER visits in previous 12 months Vitals Screenings to include cognitive, depression, and falls Referrals and appointments  In addition, I have reviewed and discussed with patient certain preventive protocols, quality metrics, and best practice recommendations. A written personalized care plan for preventive services as well as general preventive health recommendations were provided to patient.     NKellie Simmering LPN   140/05/2724  Nurse Notes: none  Due to this being a virtual visit, the after visit summary with patients personalized plan was offered to patient via mail or my-chart. Patient would like to access on my-chart

## 2022-06-26 ENCOUNTER — Other Ambulatory Visit: Payer: Self-pay | Admitting: Primary Care

## 2022-06-26 DIAGNOSIS — I10 Essential (primary) hypertension: Secondary | ICD-10-CM

## 2022-07-21 ENCOUNTER — Other Ambulatory Visit: Payer: Self-pay | Admitting: Primary Care

## 2022-07-21 DIAGNOSIS — E039 Hypothyroidism, unspecified: Secondary | ICD-10-CM

## 2022-08-11 ENCOUNTER — Other Ambulatory Visit: Payer: Self-pay | Admitting: Primary Care

## 2022-08-11 DIAGNOSIS — F418 Other specified anxiety disorders: Secondary | ICD-10-CM

## 2022-08-11 NOTE — Telephone Encounter (Signed)
Patient is due for CPE/follow up in late February 2024. Please schedule.

## 2022-08-11 NOTE — Telephone Encounter (Signed)
Patient has been scheduled

## 2022-08-20 DIAGNOSIS — M1711 Unilateral primary osteoarthritis, right knee: Secondary | ICD-10-CM | POA: Diagnosis not present

## 2022-08-20 DIAGNOSIS — M170 Bilateral primary osteoarthritis of knee: Secondary | ICD-10-CM | POA: Diagnosis not present

## 2022-08-20 DIAGNOSIS — M79671 Pain in right foot: Secondary | ICD-10-CM | POA: Diagnosis not present

## 2022-08-20 DIAGNOSIS — M1712 Unilateral primary osteoarthritis, left knee: Secondary | ICD-10-CM | POA: Diagnosis not present

## 2022-09-11 DIAGNOSIS — H524 Presbyopia: Secondary | ICD-10-CM | POA: Diagnosis not present

## 2022-09-11 DIAGNOSIS — H5203 Hypermetropia, bilateral: Secondary | ICD-10-CM | POA: Diagnosis not present

## 2022-09-11 DIAGNOSIS — H2513 Age-related nuclear cataract, bilateral: Secondary | ICD-10-CM | POA: Diagnosis not present

## 2022-09-11 DIAGNOSIS — H02831 Dermatochalasis of right upper eyelid: Secondary | ICD-10-CM | POA: Diagnosis not present

## 2022-09-24 ENCOUNTER — Other Ambulatory Visit: Payer: Self-pay | Admitting: Primary Care

## 2022-09-24 DIAGNOSIS — I10 Essential (primary) hypertension: Secondary | ICD-10-CM

## 2022-10-07 ENCOUNTER — Encounter: Payer: PPO | Admitting: Primary Care

## 2022-10-08 ENCOUNTER — Encounter: Payer: Self-pay | Admitting: Primary Care

## 2022-10-08 ENCOUNTER — Ambulatory Visit (INDEPENDENT_AMBULATORY_CARE_PROVIDER_SITE_OTHER): Payer: PPO | Admitting: Primary Care

## 2022-10-08 VITALS — BP 136/78 | HR 65 | Temp 98.4°F | Ht 64.0 in | Wt 108.0 lb

## 2022-10-08 DIAGNOSIS — L659 Nonscarring hair loss, unspecified: Secondary | ICD-10-CM

## 2022-10-08 DIAGNOSIS — N898 Other specified noninflammatory disorders of vagina: Secondary | ICD-10-CM | POA: Diagnosis not present

## 2022-10-08 DIAGNOSIS — E039 Hypothyroidism, unspecified: Secondary | ICD-10-CM | POA: Diagnosis not present

## 2022-10-08 DIAGNOSIS — Z0000 Encounter for general adult medical examination without abnormal findings: Secondary | ICD-10-CM

## 2022-10-08 DIAGNOSIS — I10 Essential (primary) hypertension: Secondary | ICD-10-CM | POA: Diagnosis not present

## 2022-10-08 DIAGNOSIS — F418 Other specified anxiety disorders: Secondary | ICD-10-CM | POA: Diagnosis not present

## 2022-10-08 DIAGNOSIS — G4700 Insomnia, unspecified: Secondary | ICD-10-CM

## 2022-10-08 DIAGNOSIS — E785 Hyperlipidemia, unspecified: Secondary | ICD-10-CM | POA: Diagnosis not present

## 2022-10-08 DIAGNOSIS — M1990 Unspecified osteoarthritis, unspecified site: Secondary | ICD-10-CM

## 2022-10-08 LAB — COMPREHENSIVE METABOLIC PANEL
ALT: 17 U/L (ref 0–35)
AST: 21 U/L (ref 0–37)
Albumin: 4.6 g/dL (ref 3.5–5.2)
Alkaline Phosphatase: 55 U/L (ref 39–117)
BUN: 14 mg/dL (ref 6–23)
CO2: 28 mEq/L (ref 19–32)
Calcium: 9.5 mg/dL (ref 8.4–10.5)
Chloride: 102 mEq/L (ref 96–112)
Creatinine, Ser: 0.67 mg/dL (ref 0.40–1.20)
GFR: 88.54 mL/min (ref >60.00)
Glucose, Bld: 81 mg/dL (ref 70–99)
Potassium: 4.4 mEq/L (ref 3.5–5.1)
Sodium: 137 mEq/L (ref 135–145)
Total Bilirubin: 0.7 mg/dL (ref 0.2–1.2)
Total Protein: 6.7 g/dL (ref 6.0–8.3)

## 2022-10-08 LAB — LIPID PANEL
Cholesterol: 219 mg/dL — ABNORMAL HIGH (ref 0–200)
HDL: 88.2 mg/dL (ref >39.00)
LDL Cholesterol: 119 mg/dL — ABNORMAL HIGH (ref 0–99)
NonHDL: 130.77
Total CHOL/HDL Ratio: 2
Triglycerides: 60 mg/dL (ref 0.0–149.0)
VLDL: 12 mg/dL (ref 0.0–40.0)

## 2022-10-08 LAB — TSH: TSH: 1.38 u[IU]/mL (ref 0.35–5.50)

## 2022-10-08 NOTE — Assessment & Plan Note (Signed)
Controlled.  Continue mirtazapine 15 mg HS.

## 2022-10-08 NOTE — Assessment & Plan Note (Signed)
Discussed shingrix vaccine, she will think about this. Declines influenza vaccine. Mammogram and bone density scan UTD. Colonoscopy UTD, due 2026  Discussed the importance of a healthy diet and regular exercise in order for weight loss, and to reduce the risk of further co-morbidity.  Exam stable. Labs pending.  Follow up in 1 year for repeat physical.'

## 2022-10-08 NOTE — Assessment & Plan Note (Signed)
Following with dermatology.  Continue finasteride 5 mg daily and minoxidil 2.5 mg daily.

## 2022-10-08 NOTE — Assessment & Plan Note (Signed)
Controlled.  Continue mirtazapine 15 mg daily.

## 2022-10-08 NOTE — Assessment & Plan Note (Signed)
Commended her on daily activity. Repeat lipid panel pending.

## 2022-10-08 NOTE — Assessment & Plan Note (Signed)
Waxes and wanes.  Continue Meloxicam 15 mg daily PRN. Renal function pending.

## 2022-10-08 NOTE — Assessment & Plan Note (Signed)
She is taking levothyroxine with coffee. Discussed proper instructions for taking.  Continue levothyroxine 112 mcg daily. Repeat TSH pending.

## 2022-10-08 NOTE — Patient Instructions (Signed)
Stop by the lab prior to leaving today. I will notify you of your results once received.   It was a pleasure to see you today!  

## 2022-10-08 NOTE — Progress Notes (Signed)
Subjective:    Patient ID: Sandra [name], female    DOB: 06/11/1951, 72 y.o.   MRN: [id number]  HPI  Sandra [name] is a very pleasant 72 y.o. female who presents today for complete physical and follow up of chronic conditions.  Immunizations: -Tetanus: Completed in 2017 -Influenza: Declines  -Shingles: Never completed  -Pneumonia: Completed Prevnar 13 in 2019 and Pneumovax 23 in 2020  Diet: [place].  Exercise: Walking daily  Eye exam: Completes annually  Dental exam: Completes semi-annually   Mammogram: Completed in August 2023  Colonoscopy: Completed in 2021, due 2026 Dexa: Completed in August 2023   BP Readings from Last 3 Encounters:  10/08/22 136/78  01/09/22 120/70  12/03/21 124/78       Review of Systems  Constitutional:  Negative for unexpected weight change.  HENT:  Negative for rhinorrhea.   Respiratory:  Negative for cough and shortness of breath.   Cardiovascular:  Negative for chest pain.  Gastrointestinal:  Negative for constipation and diarrhea.  Genitourinary:  Negative for difficulty urinating.  Musculoskeletal:  Negative for arthralgias and myalgias.  Skin:  Negative for rash.  Allergic/Immunologic: Negative for environmental allergies.  Neurological:  Negative for dizziness, numbness and headaches.  Psychiatric/Behavioral:  The patient is not nervous/anxious.          Past Medical History:  Diagnosis Date   COVID-19 virus infection 09/26/2021   Hypertension    Thyroid disease     Social History   Socioeconomic History   Marital status: Married    Spouse name: Not on file   Number of children: Not on file   Years of education: Not on file   Highest education level: Not on file  Occupational History   Not on file  Tobacco Use   Smoking status: Never   Smokeless tobacco: Never  Vaping Use   Vaping Use: Never used  Substance and Sexual Activity   Alcohol use: Yes   Drug use: No   Sexual activity: Not on file   Other Topics Concern   Not on file  Social History Narrative   Married.   2 children, no grandchildren.   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    Has a living will   Social Determinants of Health   Financial Resource Strain: Low Risk  (06/15/2022)   Overall Financial Resource Strain (CARDIA)    Difficulty of Paying Living Expenses: Not hard at all  Food Insecurity: No Food Insecurity (06/15/2022)   Hunger Vital Sign    Worried About Running Out of Food in the Last Year: Never true    Ran Out of Food in the Last Year: Never true  Transportation Needs: No Transportation Needs (06/15/2022)   PRAPARE - Hydrologist (Medical): No    Lack of Transportation (Non-Medical): No  Physical Activity: Sufficiently Active (06/15/2022)   Exercise Vital Sign    Days of Exercise per Week: 6 days    Minutes of Exercise per Session: 30 min  Stress: No Stress Concern Present (06/15/2022)   [place]    Feeling of Stress : Not at all  Social Connections: Not on file  Intimate Partner Violence: Not At Risk (05/18/2019)   Humiliation, Afraid, Rape, and Kick questionnaire    Fear of Current or  Ex-Partner: No    Emotionally Abused: No    Physically Abused: No    Sexually Abused: No    Past Surgical History:  Procedure Laterality Date   CESAREAN SECTION     X 2   COLONOSCOPY WITH PROPOFOL N/A 07/23/2020   Procedure: COLONOSCOPY WITH PROPOFOL;  Surgeon: Virgel Manifold, MD;  Location: [facility] ENDOSCOPY;  Service: Endoscopy;  Laterality: N/A;    Family History  Problem Relation Age of Onset   Arthritis Mother        s/p hip replacement   Hyperlipidemia Mother    Heart disease Mother    Hypertension Mother    Arthritis Father    Hyperlipidemia Father    Heart disease Father     Hypertension Father    Dementia Father    Thrombocytopenia Son    Hearing loss Son    Thrombocytopenia Son     No Known Allergies  Current Outpatient Medications on File Prior to Visit  Medication Sig Dispense Refill   calcium-vitamin D (OSCAL WITH D) 500-200 MG-UNIT tablet Take 2 tablets by mouth daily.     finasteride (PROSCAR) 5 MG tablet finasteride 5 mg tablet     fluticasone (FLONASE) 50 MCG/ACT nasal spray USE ONE TO TWO SPRAYS IN EACH NOSTRIL EVERYDAY IF NEEDED 16 g 3   levothyroxine (SYNTHROID) 112 MCG tablet Take 1 tablet by mouth every morning on an empty stomach with water only.  No food or other medications for 30 minutes. 90 tablet 3   loratadine (CLARITIN) 10 MG tablet Take 1 tablet (10 mg total) by mouth daily. 30 tablet 11   losartan (COZAAR) 50 MG tablet TAKE 1 TABLET BY MOUTH DAILY FOR BLOOD PRESSURE 90 tablet 0   meloxicam (MOBIC) 15 MG tablet TAKE 1 TABLET BY MOUTH EVERY DAY AS NEEDED FOR PAIN. TAKE WITH FOOD. 90 tablet 3   minoxidil (LONITEN) 2.5 MG tablet Take 1.25 mg by mouth daily.     mirtazapine (REMERON SOL-TAB) 15 MG disintegrating tablet Take 1 tablet (15 mg total) by mouth at bedtime. For sleep. 90 tablet 2   estradiol (ESTRACE) 0.1 MG/GM vaginal cream INSERT 1 GRAM 1 TO 3 TIMES WEEKLY (Patient not taking: Reported on 10/08/2022) 42.5 g 1   No current facility-administered medications on file prior to visit.    BP 136/78   Pulse 65   Temp 98.4 F (36.9 C) (Temporal)   Ht 5' 4"$  (1.626 m)   Wt 108 lb (49 kg)   SpO2 99%   BMI 18.54 kg/m  Objective:   Physical Exam HENT:     Right Ear: Tympanic membrane and ear canal normal.     Left Ear: Tympanic membrane and ear canal normal.     Nose: Nose normal.  Eyes:     Conjunctiva/sclera: Conjunctivae normal.     Pupils: Pupils are equal, round, and reactive to light.  Neck:     Thyroid: No thyromegaly.  Cardiovascular:     Rate and Rhythm: Normal rate and regular rhythm.     Heart sounds: No murmur  heard. Pulmonary:     Effort: Pulmonary effort is normal.     Breath sounds: Normal breath sounds. No rales.  Abdominal:     General: Bowel sounds are normal.     Palpations: Abdomen is soft.     Tenderness: There is no abdominal tenderness.  Musculoskeletal:        General: Normal range of motion.     Cervical back: Neck  supple.  Lymphadenopathy:     Cervical: No cervical adenopathy.  Skin:    General: Skin is warm and dry.     Findings: No rash.  Neurological:     Mental Status: She is alert and oriented to person, place, and time.     Cranial Nerves: No cranial nerve deficit.     Deep Tendon Reflexes: Reflexes are normal and symmetric.  Psychiatric:        Mood and Affect: Mood normal.           Assessment & Plan:  Preventative health care Assessment & Plan: Discussed shingrix vaccine, she will think about this. Declines influenza vaccine. Mammogram and bone density scan UTD. Colonoscopy UTD, due 2026  Discussed the importance of a healthy diet and regular exercise in order for weight loss, and to reduce the risk of further co-morbidity.  Exam stable. Labs pending.  Follow up in 1 year for repeat physical.'   Primary hypertension Assessment & Plan: Controlled.   Continue losartan 50 mg daily. CMP pending.   Hypothyroidism, unspecified type Assessment & Plan: She is taking levothyroxine with coffee. Discussed proper instructions for taking.  Continue levothyroxine 112 mcg daily. Repeat TSH pending.  Orders: -     TSH  Alopecia Assessment & Plan: Following with dermatology.  Continue finasteride 5 mg daily and minoxidil 2.5 mg daily.    Arthritis Assessment & Plan: Waxes and wanes.  Continue Meloxicam 15 mg daily PRN. Renal function pending.   Vaginal dryness Assessment & Plan: Continued, also with irregular use of Estrace cream.    Anxiety with depression Assessment & Plan: Controlled.  Continue mirtazapine 15 mg  HS.   Insomnia, unspecified type Assessment & Plan: Controlled.  Continue mirtazapine 15 mg daily.    Hyperlipidemia, unspecified hyperlipidemia type Assessment & Plan: Commended her on daily activity. Repeat lipid panel pending.  Orders: -     Lipid panel -     Comprehensive metabolic panel        Pleas Koch, NP

## 2022-10-08 NOTE — Assessment & Plan Note (Signed)
Controlled.   Continue losartan 50 mg daily. CMP pending.

## 2022-10-08 NOTE — Assessment & Plan Note (Signed)
Continued, also with irregular use of Estrace cream.

## 2022-10-23 ENCOUNTER — Telehealth: Payer: Self-pay

## 2022-10-23 DIAGNOSIS — E039 Hypothyroidism, unspecified: Secondary | ICD-10-CM

## 2022-10-23 NOTE — Telephone Encounter (Signed)
Received refill request for  

## 2022-10-24 MED ORDER — LEVOTHYROXINE SODIUM 112 MCG PO TABS: ORAL_TABLET | ORAL | 3 refills | Status: DC

## 2022-10-24 NOTE — Telephone Encounter (Signed)
Requested Prescriptions   Signed Prescriptions Disp Refills   levothyroxine (SYNTHROID) 112 MCG tablet 90 tablet 3    Sig: Take 1 tablet by mouth every morning on an empty stomach with water only.  No food or other medications for 30 minutes.    Authorizing Provider: Pleas Koch   Refill(s) sent to pharmacy.

## 2022-10-24 NOTE — Addendum Note (Signed)
Addended by: Pleas Koch on: 10/24/2022 03:42 PM   Modules accepted: Orders

## 2022-11-09 ENCOUNTER — Other Ambulatory Visit: Payer: Self-pay | Admitting: Primary Care

## 2022-11-09 DIAGNOSIS — M1990 Unspecified osteoarthritis, unspecified site: Secondary | ICD-10-CM

## 2022-11-09 DIAGNOSIS — N898 Other specified noninflammatory disorders of vagina: Secondary | ICD-10-CM

## 2022-11-11 ENCOUNTER — Other Ambulatory Visit: Payer: Self-pay | Admitting: Primary Care

## 2022-11-11 DIAGNOSIS — F418 Other specified anxiety disorders: Secondary | ICD-10-CM

## 2022-12-09 DIAGNOSIS — M170 Bilateral primary osteoarthritis of knee: Secondary | ICD-10-CM | POA: Diagnosis not present

## 2022-12-16 ENCOUNTER — Other Ambulatory Visit: Payer: Self-pay | Admitting: Primary Care

## 2022-12-16 DIAGNOSIS — M170 Bilateral primary osteoarthritis of knee: Secondary | ICD-10-CM | POA: Diagnosis not present

## 2022-12-16 DIAGNOSIS — J301 Allergic rhinitis due to pollen: Secondary | ICD-10-CM

## 2022-12-23 DIAGNOSIS — M170 Bilateral primary osteoarthritis of knee: Secondary | ICD-10-CM | POA: Diagnosis not present

## 2022-12-25 ENCOUNTER — Other Ambulatory Visit: Payer: Self-pay | Admitting: Primary Care

## 2022-12-25 DIAGNOSIS — I10 Essential (primary) hypertension: Secondary | ICD-10-CM

## 2023-01-08 DIAGNOSIS — H02409 Unspecified ptosis of unspecified eyelid: Secondary | ICD-10-CM | POA: Diagnosis not present

## 2023-02-09 ENCOUNTER — Other Ambulatory Visit: Payer: Self-pay | Admitting: Primary Care

## 2023-02-09 DIAGNOSIS — M1990 Unspecified osteoarthritis, unspecified site: Secondary | ICD-10-CM

## 2023-02-10 ENCOUNTER — Other Ambulatory Visit: Payer: Self-pay | Admitting: Primary Care

## 2023-02-10 DIAGNOSIS — M25562 Pain in left knee: Secondary | ICD-10-CM | POA: Diagnosis not present

## 2023-02-10 DIAGNOSIS — M25561 Pain in right knee: Secondary | ICD-10-CM | POA: Diagnosis not present

## 2023-02-10 DIAGNOSIS — N898 Other specified noninflammatory disorders of vagina: Secondary | ICD-10-CM

## 2023-02-10 NOTE — Telephone Encounter (Signed)
Please call patient:  We received a refill request for her vaginal cream.  Is she already out?   Her last Rx was prescribed in March 2024 with an extra refill

## 2023-02-11 NOTE — Telephone Encounter (Signed)
Called patient she states she has 1 refill remaining at the pharmacy, she will contact them regarding this.

## 2023-02-11 NOTE — Telephone Encounter (Signed)
Noted  

## 2023-02-15 DIAGNOSIS — H02831 Dermatochalasis of right upper eyelid: Secondary | ICD-10-CM | POA: Diagnosis not present

## 2023-02-15 DIAGNOSIS — H02834 Dermatochalasis of left upper eyelid: Secondary | ICD-10-CM | POA: Diagnosis not present

## 2023-02-15 DIAGNOSIS — H02403 Unspecified ptosis of bilateral eyelids: Secondary | ICD-10-CM | POA: Diagnosis not present

## 2023-04-21 DIAGNOSIS — M170 Bilateral primary osteoarthritis of knee: Secondary | ICD-10-CM | POA: Diagnosis not present

## 2023-05-11 ENCOUNTER — Other Ambulatory Visit: Payer: Self-pay | Admitting: Primary Care

## 2023-05-11 DIAGNOSIS — N898 Other specified noninflammatory disorders of vagina: Secondary | ICD-10-CM

## 2023-05-17 ENCOUNTER — Other Ambulatory Visit: Payer: Self-pay | Admitting: Primary Care

## 2023-05-17 DIAGNOSIS — M1990 Unspecified osteoarthritis, unspecified site: Secondary | ICD-10-CM

## 2023-05-21 ENCOUNTER — Other Ambulatory Visit: Payer: Self-pay | Admitting: Primary Care

## 2023-05-21 DIAGNOSIS — F418 Other specified anxiety disorders: Secondary | ICD-10-CM

## 2023-06-01 ENCOUNTER — Encounter: Payer: Self-pay | Admitting: Family

## 2023-06-01 ENCOUNTER — Ambulatory Visit (INDEPENDENT_AMBULATORY_CARE_PROVIDER_SITE_OTHER): Payer: PPO | Admitting: Family

## 2023-06-01 VITALS — BP 132/86 | HR 78 | Temp 98.6°F | Ht 63.0 in | Wt 104.0 lb

## 2023-06-01 DIAGNOSIS — R197 Diarrhea, unspecified: Secondary | ICD-10-CM | POA: Insufficient documentation

## 2023-06-01 DIAGNOSIS — T781XXA Other adverse food reactions, not elsewhere classified, initial encounter: Secondary | ICD-10-CM

## 2023-06-01 DIAGNOSIS — R143 Flatulence: Secondary | ICD-10-CM

## 2023-06-01 DIAGNOSIS — R195 Other fecal abnormalities: Secondary | ICD-10-CM

## 2023-06-01 LAB — CBC WITH DIFFERENTIAL/PLATELET
Basophils Absolute: 0 10*3/uL (ref 0.0–0.1)
Basophils Relative: 0.4 % (ref 0.0–3.0)
Eosinophils Absolute: 0 10*3/uL (ref 0.0–0.7)
Eosinophils Relative: 0.1 % (ref 0.0–5.0)
HCT: 40.5 % (ref 36.0–46.0)
Hemoglobin: 13.3 g/dL (ref 12.0–15.0)
Lymphocytes Relative: 16.3 % (ref 12.0–46.0)
Lymphs Abs: 0.8 10*3/uL (ref 0.7–4.0)
MCHC: 32.8 g/dL (ref 30.0–36.0)
MCV: 93.8 fL (ref 78.0–100.0)
Monocytes Absolute: 0.5 10*3/uL (ref 0.1–1.0)
Monocytes Relative: 10 % (ref 3.0–12.0)
Neutro Abs: 3.4 10*3/uL (ref 1.4–7.7)
Neutrophils Relative %: 73.2 % (ref 43.0–77.0)
Platelets: 210 10*3/uL (ref 150.0–400.0)
RBC: 4.32 Mil/uL (ref 3.87–5.11)
RDW: 13.2 % (ref 11.5–15.5)
WBC: 4.7 10*3/uL (ref 4.0–10.5)

## 2023-06-01 LAB — COMPREHENSIVE METABOLIC PANEL
ALT: 14 U/L (ref 0–35)
AST: 20 U/L (ref 0–37)
Albumin: 4.4 g/dL (ref 3.5–5.2)
Alkaline Phosphatase: 52 U/L (ref 39–117)
BUN: 13 mg/dL (ref 6–23)
CO2: 29 meq/L (ref 19–32)
Calcium: 9.5 mg/dL (ref 8.4–10.5)
Chloride: 102 meq/L (ref 96–112)
Creatinine, Ser: 0.67 mg/dL (ref 0.40–1.20)
GFR: 88.14 mL/min (ref >60.00)
Glucose, Bld: 104 mg/dL — ABNORMAL HIGH (ref 70–99)
Potassium: 4.2 meq/L (ref 3.5–5.1)
Sodium: 139 meq/L (ref 135–145)
Total Bilirubin: 0.7 mg/dL (ref 0.2–1.2)
Total Protein: 6.7 g/dL (ref 6.0–8.3)

## 2023-06-01 NOTE — Assessment & Plan Note (Signed)
Suspected infectious process Stool cultures ordered pending results.  Stop probiotic to see if any symptom improvement Gas x would be ok but may not provide complete symptom relief.  Gave her handout for FOD map and advised a bland diet.  Red flag symptoms to include fever, vomiting abdominal pain were discussed.  Cbc to r/o bacterial  Ddx cholecystitis, Gastroenteritis

## 2023-06-01 NOTE — Progress Notes (Signed)
Established Patient Office Visit  Subjective:      CC:  Chief Complaint  Patient presents with   Abdominal Pain    Reports stomach cramps, diarrhea x1 week. Has upcoming appointment and she feels like she has "psyched" herself out over this.    HPI: Sandra [name] is a 72 y.o. female presenting on 06/01/2023 for Abdominal Pain (Reports stomach cramps, diarrhea x1 week. Has upcoming appointment and she feels like she has "psyched" herself out over this.) . C/o three weeks of loud gas and then diarrhea which since has increased in frequency. Over the weekend it was about once daily and then yesterday she had about 2 episodes, soft semi formed but a good amount. There is no mucous or blood she is seeing in the stool. No abdominal pain, just gassy. Does have abdominal cramping which has resolved as of today. No nausea or vomiting. She is able to eat and tolerating well. No fever. No urinary symptoms. These symptoms are abn for her.  She was taking a probiotic and then switched to florastor advanced, started about four days ago, with some improvement until yesterday which she reports as a 'setback'.   She is having procedure on her eyelids next week, and now is anxious and thinks she may have worked herself up over this procedure.   Colonoscopy 07/23/20, repeat in five years.  Has lost about 7 pounds since this has started.   Wt Readings from Last 3 Encounters:  06/01/23 104 lb (47.2 kg)  10/08/22 108 lb (49 kg)  06/15/22 110 lb (49.9 kg)   She does report at the beginning of symptoms she ate a boston butt made by her son, and since then has had these concerns. She does not report an issue with beef or pork In the past.   Social history:  Relevant past medical, surgical, family and social history reviewed and updated as indicated. Interim medical history since our last visit reviewed.  Allergies and medications reviewed and updated.  DATA REVIEWED: CHART IN EPIC     ROS:  Negative unless specifically indicated above in HPI.    Current Outpatient Medications:    calcium-vitamin D (OSCAL WITH D) 500-200 MG-UNIT tablet, Take 2 tablets by mouth daily., Disp: , Rfl:    estradiol (ESTRACE) 0.1 MG/GM vaginal cream, INSERT 1-3 GRAMS WEEKLY, Disp: 42.5 g, Rfl: 0   finasteride (PROSCAR) 5 MG tablet, finasteride 5 mg tablet, Disp: , Rfl:    fluticasone (FLONASE) 50 MCG/ACT nasal spray, USE 1-2 SPRAYS IN EACH NOSTRIL EVERY DAYIF NEEDED, Disp: 48 g, Rfl: 0   levothyroxine (SYNTHROID) 112 MCG tablet, Take 1 tablet by mouth every morning on an empty stomach with water only.  No food or other medications for 30 minutes., Disp: 90 tablet, Rfl: 3   loratadine (CLARITIN) 10 MG tablet, Take 1 tablet (10 mg total) by mouth daily. For allergies, Disp: 90 tablet, Rfl: 2   losartan (COZAAR) 50 MG tablet, TAKE 1 TABLET BY MOUTH DAILY FOR BLOOD PRESSURE, Disp: 90 tablet, Rfl: 2   meloxicam (MOBIC) 15 MG tablet, TAKE 1 TABLET BY MOUTH DAILY AS NEEDED FOR PAIN : TAKE WITH FOOD, Disp: 90 tablet, Rfl: 0   minoxidil (LONITEN) 2.5 MG tablet, Take 1.25 mg by mouth daily., Disp: , Rfl:    mirtazapine (REMERON SOL-TAB) 15 MG disintegrating tablet, TAKE ONE TABLET BY MOUTH AT BEDTIME FOR SLEEP., Disp: 90 tablet, Rfl: 2      Objective:    BP 132/86 (  BP Location: Left Arm, Patient Position: Sitting, Cuff Size: Normal)   Pulse 78   Temp 98.6 F (37 C) (Oral)   Ht 5\' 3"  (1.6 m)   Wt 104 lb (47.2 kg)   SpO2 98%   BMI 18.42 kg/m   Wt Readings from Last 3 Encounters:  06/01/23 104 lb (47.2 kg)  10/08/22 108 lb (49 kg)  06/15/22 110 lb (49.9 kg)    Physical Exam Abdominal:     General: Abdomen is flat. Bowel sounds are increased. There is no distension.     Tenderness: There is no abdominal tenderness.     Hernia: No hernia is present.           Assessment & Plan:  Gastrointestinal food sensitivity -     Giardia antigen -     C. difficile GDH and Toxin A/B -      Gastrointestinal Pathogen Pnl RT, PCR -     CBC with Differential/Platelet -     Alpha-Gal Panel -     Comprehensive metabolic panel  Diarrhea of presumed infectious origin Assessment & Plan: Suspected infectious process Stool cultures ordered pending results.  Stop probiotic to see if any symptom improvement Gas x would be ok but may not provide complete symptom relief.  Gave her handout for FOD map and advised a bland diet.  Red flag symptoms to include fever, vomiting abdominal pain were discussed.  Cbc to r/o bacterial  Ddx cholecystitis, Gastroenteritis   Orders: -     Giardia antigen -     C. difficile GDH and Toxin A/B -     Gastrointestinal Pathogen Pnl RT, PCR -     CBC with Differential/Platelet -     Alpha-Gal Panel -     Comprehensive metabolic panel     Return in about 1 week (around 06/08/2023) for follow up with Jae Dire for diarrhea.  Mort Sawyers, MSN, APRN, FNP-C [facility]

## 2023-06-02 ENCOUNTER — Other Ambulatory Visit: Payer: PPO

## 2023-06-02 LAB — CELIAC DISEASE PANEL
(tTG) Ab, IgA: <1 U/mL
(tTG) Ab, IgG: <1 U/mL
Gliadin IgA: 2.2 U/mL
Gliadin IgG: 5.1 U/mL
Immunoglobulin A: 214 mg/dL (ref 70–320)

## 2023-06-02 LAB — C. DIFFICILE GDH AND TOXIN A/B
GDH ANTIGEN: NOT DETECTED
MICRO NUMBER:: 15596957
SPECIMEN QUALITY:: ADEQUATE
TOXIN A AND B: NOT DETECTED

## 2023-06-03 DIAGNOSIS — I10 Essential (primary) hypertension: Secondary | ICD-10-CM | POA: Diagnosis not present

## 2023-06-03 DIAGNOSIS — H0231 Blepharochalasis right upper eyelid: Secondary | ICD-10-CM | POA: Diagnosis not present

## 2023-06-03 DIAGNOSIS — H02834 Dermatochalasis of left upper eyelid: Secondary | ICD-10-CM | POA: Diagnosis not present

## 2023-06-03 DIAGNOSIS — H0234 Blepharochalasis left upper eyelid: Secondary | ICD-10-CM | POA: Diagnosis not present

## 2023-06-03 DIAGNOSIS — H02403 Unspecified ptosis of bilateral eyelids: Secondary | ICD-10-CM | POA: Diagnosis not present

## 2023-06-03 DIAGNOSIS — H02831 Dermatochalasis of right upper eyelid: Secondary | ICD-10-CM | POA: Diagnosis not present

## 2023-06-03 DIAGNOSIS — E039 Hypothyroidism, unspecified: Secondary | ICD-10-CM | POA: Diagnosis not present

## 2023-06-03 DIAGNOSIS — H53453 Other localized visual field defect, bilateral: Secondary | ICD-10-CM | POA: Diagnosis not present

## 2023-06-04 LAB — ALPHA-GAL PANEL
Allergen, Mutton, f88: <0.1 kU/L
Allergen, Pork, f26: <0.1 kU/L
Beef: <0.1 kU/L
CLASS: 0
CLASS: 0
Class: 0
GALACTOSE-ALPHA-1,3-GALACTOSE IGE*: <0.1 kU/L (ref <0.10)

## 2023-06-04 LAB — INTERPRETATION:

## 2023-06-09 ENCOUNTER — Ambulatory Visit: Payer: PPO | Admitting: Primary Care

## 2023-06-09 LAB — GASTROINTESTINAL PATHOGEN PNL
CampyloBacter Group: NOT DETECTED
Norovirus GI/GII: NOT DETECTED
Rotavirus A: NOT DETECTED
Salmonella species: NOT DETECTED
Shiga Toxin 1: NOT DETECTED
Shiga Toxin 2: NOT DETECTED
Shigella Species: NOT DETECTED
Vibrio Group: NOT DETECTED
Yersinia enterocolitica: NOT DETECTED

## 2023-06-09 LAB — GIARDIA ANTIGEN
MICRO NUMBER:: 15596918
RESULT:: NOT DETECTED
SPECIMEN QUALITY:: ADEQUATE

## 2023-06-15 DIAGNOSIS — F418 Other specified anxiety disorders: Secondary | ICD-10-CM

## 2023-06-15 MED ORDER — HYDROXYZINE HCL 10 MG PO TABS: 10.0000 mg | ORAL_TABLET | Freq: Two times a day (BID) | ORAL | 0 refills | Status: DC | PRN

## 2023-07-12 ENCOUNTER — Other Ambulatory Visit: Payer: Self-pay | Admitting: Primary Care

## 2023-07-12 DIAGNOSIS — E039 Hypothyroidism, unspecified: Secondary | ICD-10-CM

## 2023-07-14 DIAGNOSIS — L821 Other seborrheic keratosis: Secondary | ICD-10-CM | POA: Diagnosis not present

## 2023-07-14 DIAGNOSIS — D2272 Melanocytic nevi of left lower limb, including hip: Secondary | ICD-10-CM | POA: Diagnosis not present

## 2023-07-14 DIAGNOSIS — D225 Melanocytic nevi of trunk: Secondary | ICD-10-CM | POA: Diagnosis not present

## 2023-07-14 DIAGNOSIS — D2261 Melanocytic nevi of right upper limb, including shoulder: Secondary | ICD-10-CM | POA: Diagnosis not present

## 2023-07-14 DIAGNOSIS — D2271 Melanocytic nevi of right lower limb, including hip: Secondary | ICD-10-CM | POA: Diagnosis not present

## 2023-07-14 DIAGNOSIS — L658 Other specified nonscarring hair loss: Secondary | ICD-10-CM | POA: Diagnosis not present

## 2023-07-14 DIAGNOSIS — D2262 Melanocytic nevi of left upper limb, including shoulder: Secondary | ICD-10-CM | POA: Diagnosis not present

## 2023-07-21 DIAGNOSIS — Z1231 Encounter for screening mammogram for malignant neoplasm of breast: Secondary | ICD-10-CM | POA: Diagnosis not present

## 2023-07-21 LAB — HM MAMMOGRAPHY

## 2023-08-23 ENCOUNTER — Other Ambulatory Visit: Payer: Self-pay | Admitting: Primary Care

## 2023-08-23 DIAGNOSIS — F418 Other specified anxiety disorders: Secondary | ICD-10-CM

## 2023-08-28 ENCOUNTER — Other Ambulatory Visit: Payer: Self-pay | Admitting: Primary Care

## 2023-08-28 DIAGNOSIS — M1990 Unspecified osteoarthritis, unspecified site: Secondary | ICD-10-CM

## 2023-09-01 ENCOUNTER — Other Ambulatory Visit: Payer: Self-pay | Admitting: Primary Care

## 2023-09-01 DIAGNOSIS — J301 Allergic rhinitis due to pollen: Secondary | ICD-10-CM

## 2023-09-14 DIAGNOSIS — M170 Bilateral primary osteoarthritis of knee: Secondary | ICD-10-CM | POA: Diagnosis not present

## 2023-09-30 ENCOUNTER — Other Ambulatory Visit: Payer: Self-pay | Admitting: Primary Care

## 2023-09-30 DIAGNOSIS — I10 Essential (primary) hypertension: Secondary | ICD-10-CM

## 2023-10-06 ENCOUNTER — Other Ambulatory Visit: Payer: Self-pay | Admitting: Primary Care

## 2023-10-06 DIAGNOSIS — E039 Hypothyroidism, unspecified: Secondary | ICD-10-CM

## 2023-10-12 ENCOUNTER — Encounter: Payer: PPO | Admitting: Primary Care

## 2023-10-13 ENCOUNTER — Ambulatory Visit (INDEPENDENT_AMBULATORY_CARE_PROVIDER_SITE_OTHER): Payer: PPO | Admitting: Primary Care

## 2023-10-13 ENCOUNTER — Encounter: Payer: Self-pay | Admitting: Primary Care

## 2023-10-13 VITALS — BP 138/82 | HR 62 | Temp 97.3°F | Ht 63.0 in | Wt 106.0 lb

## 2023-10-13 DIAGNOSIS — E785 Hyperlipidemia, unspecified: Secondary | ICD-10-CM | POA: Diagnosis not present

## 2023-10-13 DIAGNOSIS — Z0000 Encounter for general adult medical examination without abnormal findings: Secondary | ICD-10-CM

## 2023-10-13 DIAGNOSIS — L659 Nonscarring hair loss, unspecified: Secondary | ICD-10-CM

## 2023-10-13 DIAGNOSIS — F418 Other specified anxiety disorders: Secondary | ICD-10-CM

## 2023-10-13 DIAGNOSIS — E039 Hypothyroidism, unspecified: Secondary | ICD-10-CM | POA: Diagnosis not present

## 2023-10-13 DIAGNOSIS — G4700 Insomnia, unspecified: Secondary | ICD-10-CM

## 2023-10-13 DIAGNOSIS — I10 Essential (primary) hypertension: Secondary | ICD-10-CM

## 2023-10-13 DIAGNOSIS — R143 Flatulence: Secondary | ICD-10-CM | POA: Diagnosis not present

## 2023-10-13 DIAGNOSIS — N898 Other specified noninflammatory disorders of vagina: Secondary | ICD-10-CM

## 2023-10-13 LAB — LIPID PANEL
Cholesterol: 209 mg/dL — ABNORMAL HIGH (ref 0–200)
HDL: 88.3 mg/dL (ref >39.00)
LDL Cholesterol: 109 mg/dL — ABNORMAL HIGH (ref 0–99)
NonHDL: 120.49
Total CHOL/HDL Ratio: 2
Triglycerides: 58 mg/dL (ref 0.0–149.0)
VLDL: 11.6 mg/dL (ref 0.0–40.0)

## 2023-10-13 LAB — COMPREHENSIVE METABOLIC PANEL
ALT: 14 U/L (ref 0–35)
AST: 19 U/L (ref 0–37)
Albumin: 4.6 g/dL (ref 3.5–5.2)
Alkaline Phosphatase: 68 U/L (ref 39–117)
BUN: 12 mg/dL (ref 6–23)
CO2: 29 meq/L (ref 19–32)
Calcium: 9.5 mg/dL (ref 8.4–10.5)
Chloride: 100 meq/L (ref 96–112)
Creatinine, Ser: 0.7 mg/dL (ref 0.40–1.20)
GFR: 86.99 mL/min (ref >60.00)
Glucose, Bld: 85 mg/dL (ref 70–99)
Potassium: 4.5 meq/L (ref 3.5–5.1)
Sodium: 137 meq/L (ref 135–145)
Total Bilirubin: 0.7 mg/dL (ref 0.2–1.2)
Total Protein: 7.1 g/dL (ref 6.0–8.3)

## 2023-10-13 LAB — TSH: TSH: 0.86 u[IU]/mL (ref 0.35–5.50)

## 2023-10-13 MED ORDER — ESTRADIOL 0.1 MG/GM VA CREA: TOPICAL_CREAM | VAGINAL | 0 refills | Status: DC

## 2023-10-13 NOTE — Assessment & Plan Note (Signed)
 Following with dermatology.  Continue finasteride 5 mg daily and minoxidil 1.2 mg daily.

## 2023-10-13 NOTE — Assessment & Plan Note (Signed)
 Discussed Shingrix vaccines. Declines influenza vaccine.  Mammogram UTD Colonoscopy UTD, due 2026  Discussed the importance of a healthy diet and regular exercise in order for weight loss, and to reduce the risk of further co-morbidity.  Exam stable. Labs pending.  Follow up in 1 year for repeat physical.

## 2023-10-13 NOTE — Progress Notes (Signed)
 Subjective:    Patient ID: Sandra [name], female    DOB: 1950-08-31, 73 y.o.   MRN: [id number]  HPI  Sandra [name] is a very pleasant 73 y.o. female who presents today for complete physical and follow up of chronic conditions.  Immunizations: -Tetanus: Completed in 2017 -Influenza: Declines influenza vaccine.  -Shingles: Never completed  -Pneumonia: Completed Prevnar 13 in 2019, Pneumovax 23 in 2020.  Diet: Fair diet.  Exercise: No regular exercise.  Eye exam: Completes annually  Dental exam: Completes semi-annually    Mammogram: Completed in December 2024 Bone Density Scan: Completed in August 2023  Colonoscopy: Completed in 2021, due 2026   BP Readings from Last 3 Encounters:  10/13/23 138/82  06/01/23 132/86  10/08/22 136/78      Review of Systems  Constitutional:  Negative for unexpected weight change.  HENT:  Negative for rhinorrhea.   Respiratory:  Negative for cough and shortness of breath.   Cardiovascular:  Negative for chest pain.  Gastrointestinal:  Negative for constipation and diarrhea.  Genitourinary:  Negative for difficulty urinating and menstrual problem.  Musculoskeletal:  Negative for arthralgias and myalgias.  Skin:  Negative for rash.  Allergic/Immunologic: Negative for environmental allergies.  Neurological:  Negative for dizziness, numbness and headaches.  Psychiatric/Behavioral:  The patient is not nervous/anxious.          Past Medical History:  Diagnosis Date   COVID-19 virus infection 09/26/2021   Herpes zoster 05/24/2019   Hypertension    Loose stools 10/02/2021   Thyroid disease     Social History   Socioeconomic History   Marital status: Married    Spouse name: Not on file   Number of children: Not on file   Years of education: Not on file   Highest education level: Bachelor's degree (e.g., BA, AB, BS)  Occupational History   Not on file  Tobacco Use   Smoking status: Never   Smokeless tobacco: Never   Vaping Use   Vaping status: Never Used  Substance and Sexual Activity   Alcohol use: Yes   Drug use: No   Sexual activity: Not on file  Other Topics Concern   Not on file  Social History Narrative   Married.   2 children, no grandchildren.   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    Has a living will   Social Drivers of Corporate investment banker Strain: Low Risk  (10/12/2023)   Overall Financial Resource Strain (CARDIA)    Difficulty of Paying Living Expenses: Not hard at all  Food Insecurity: No Food Insecurity (10/12/2023)   Hunger Vital Sign    Worried About Running Out of Food in the Last Year: Never true    Ran Out of Food in the Last Year: Never true  Transportation Needs: Unknown (10/12/2023)   PRAPARE - Administrator, Civil Service (Medical): Not on file    Lack of Transportation (Non-Medical): No  Physical Activity: Insufficiently Active (10/12/2023)   Exercise Vital Sign    Days of Exercise per Week: 5 days    Minutes of Exercise per Session: 20 min  Stress: No Stress Concern Present (10/12/2023)   [employer] of Occupational Health - Occupational Stress Questionnaire    Feeling of Stress : Only a little  Social Connections: Socially Integrated (10/12/2023)   Social  Connection and Isolation Panel [NHANES]    Frequency of Communication with Friends and Family: More than three times a week    Frequency of Social Gatherings with Friends and Family: Three times a week    Attends Religious Services: More than 4 times per year    Active Member of Clubs or Organizations: Yes    Attends Banker Meetings: Never    Marital Status: Married  Catering manager Violence: Not At Risk (05/18/2019)   Humiliation, Afraid, Rape, and Kick questionnaire    Fear of Current or Ex-Partner: No    Emotionally Abused: No    Physically Abused: No     Sexually Abused: No    Past Surgical History:  Procedure Laterality Date   CESAREAN SECTION     X 2   COLONOSCOPY WITH PROPOFOL N/A 07/23/2020   Procedure: COLONOSCOPY WITH PROPOFOL;  Surgeon: Pasty Spillers, MD;  Location: [facility] ENDOSCOPY;  Service: Endoscopy;  Laterality: N/A;    Family History  Problem Relation Age of Onset   Arthritis Mother        s/p hip replacement   Hyperlipidemia Mother    Heart disease Mother    Hypertension Mother    Arthritis Father    Hyperlipidemia Father    Heart disease Father    Hypertension Father    Dementia Father    Thrombocytopenia Son    Hearing loss Son    Thrombocytopenia Son     No Known Allergies  Current Outpatient Medications on File Prior to Visit  Medication Sig Dispense Refill   calcium-vitamin D (OSCAL WITH D) 500-200 MG-UNIT tablet Take 2 tablets by mouth daily.     finasteride (PROSCAR) 5 MG tablet finasteride 5 mg tablet     fluticasone (FLONASE) 50 MCG/ACT nasal spray USE 1-2 SPRAYS IN EACH NOSTRIL EVERY DAYIF NEEDED 48 g 0   levothyroxine (SYNTHROID) 112 MCG tablet Take 1 tablet by mouth every morning on an empty stomach with water only.  No food or other medications for 30 minutes. 90 tablet 0   loratadine (CLARITIN) 10 MG tablet Take 1 tablet (10 mg total) by mouth daily. For allergies 90 tablet 2   losartan (COZAAR) 50 MG tablet TAKE 1 TABLET BY MOUTH DAILY FOR BLOOD PRESSURE 90 tablet 0   meloxicam (MOBIC) 15 MG tablet TAKE 1 TABLET BY MOUTH DAILY AS NEEDED FOR PAIN : TAKE WITH FOOD 90 tablet 0   minoxidil (LONITEN) 2.5 MG tablet Take 1.25 mg by mouth daily.     mirtazapine (REMERON SOL-TAB) 15 MG disintegrating tablet TAKE ONE TABLET BY MOUTH AT BEDTIME FOR SLEEP. 90 tablet 0   No current facility-administered medications on file prior to visit.    BP 138/82   Pulse 62   Temp (!) 97.3 F (36.3 C) (Temporal)   Ht 5\' 3"  (1.6 m)   Wt 106 lb (48.1 kg)   SpO2 97%   BMI 18.78 kg/m  Objective:    Physical Exam HENT:     Right Ear: Tympanic membrane and ear canal normal.     Left Ear: Tympanic membrane and ear canal normal.  Eyes:     Pupils: Pupils are equal, round, and reactive to light.  Cardiovascular:     Rate and Rhythm: Normal rate and regular rhythm.  Pulmonary:     Effort: Pulmonary effort is normal.     Breath sounds: Normal breath sounds.  Abdominal:     General: Bowel sounds are normal.  Palpations: Abdomen is soft.     Tenderness: There is no abdominal tenderness.  Musculoskeletal:        General: Normal range of motion.     Cervical back: Neck supple.  Skin:    General: Skin is warm and dry.  Neurological:     Mental Status: She is alert and oriented to person, place, and time.     Cranial Nerves: No cranial nerve deficit.     Deep Tendon Reflexes:     Reflex Scores:      Patellar reflexes are 2+ on the right side and 2+ on the left side. Psychiatric:        Mood and Affect: Mood normal.           Assessment & Plan:  Preventative health care Assessment & Plan: Discussed Shingrix vaccines. Declines influenza vaccine.  Mammogram UTD Colonoscopy UTD, due 2026  Discussed the importance of a healthy diet and regular exercise in order for weight loss, and to reduce the risk of further co-morbidity.  Exam stable. Labs pending.  Follow up in 1 year for repeat physical.    Primary hypertension Assessment & Plan: Controlled.  Continue losartan 50 mg daily. CMP pending.    Hypothyroidism, unspecified type Assessment & Plan: She is taking levothyroxine correctly.  Continue levothyroxine 112 mcg daily. Repeat TSH pending.  Orders: -     TSH  Alopecia Assessment & Plan: Following with dermatology.  Continue finasteride 5 mg daily and minoxidil 1.2 mg daily.   Anxiety with depression Assessment & Plan: Stable.  Continue mirtazapine 15 mg HS.   Flatulence Assessment & Plan: Resolved.  Continue to  monitor.   Hyperlipidemia, unspecified hyperlipidemia type Assessment & Plan: Repeat lipid panel pending.    Orders: -     Comprehensive metabolic panel -     Lipid panel  Insomnia, unspecified type Assessment & Plan: Controlled.  Continue mirtazapine 15 mg HS.    Vaginal dryness Assessment & Plan: Stable.  Continue Estrace vaginal cream. Refills provided.  Orders: -     Estradiol; INSERT 1-3 GRAMS WEEKLY  Dispense: 42.5 g; Refill: 0        Doreene Nest, NP

## 2023-10-13 NOTE — Assessment & Plan Note (Signed)
 Resolved.  Continue to monitor.

## 2023-10-13 NOTE — Patient Instructions (Signed)
 Stop by the lab prior to leaving today. I will notify you of your results once received.   It was a pleasure to see you today!

## 2023-10-13 NOTE — Assessment & Plan Note (Signed)
 Stable.  Continue mirtazapine 15 mg HS.

## 2023-10-13 NOTE — Assessment & Plan Note (Signed)
 Repeat lipid panel pending.

## 2023-10-13 NOTE — Assessment & Plan Note (Signed)
Controlled.   Continue losartan 50 mg daily. CMP pending. 

## 2023-10-13 NOTE — Assessment & Plan Note (Signed)
She is taking levothyroxine correctly.   Continue levothyroxine 112 mcg daily.  Repeat TSH pending. 

## 2023-10-13 NOTE — Assessment & Plan Note (Signed)
 Stable.  Continue Estrace vaginal cream. Refills provided.

## 2023-10-13 NOTE — Assessment & Plan Note (Signed)
 Controlled.  Continue mirtazapine 15 mg HS.

## 2023-11-23 ENCOUNTER — Other Ambulatory Visit: Payer: Self-pay | Admitting: Primary Care

## 2023-11-23 DIAGNOSIS — F418 Other specified anxiety disorders: Secondary | ICD-10-CM

## 2023-12-13 ENCOUNTER — Other Ambulatory Visit: Payer: Self-pay | Admitting: Primary Care

## 2023-12-13 DIAGNOSIS — H3561 Retinal hemorrhage, right eye: Secondary | ICD-10-CM | POA: Diagnosis not present

## 2023-12-13 DIAGNOSIS — M1990 Unspecified osteoarthritis, unspecified site: Secondary | ICD-10-CM

## 2023-12-17 ENCOUNTER — Other Ambulatory Visit: Payer: Self-pay | Admitting: Primary Care

## 2023-12-17 DIAGNOSIS — M170 Bilateral primary osteoarthritis of knee: Secondary | ICD-10-CM | POA: Diagnosis not present

## 2023-12-17 DIAGNOSIS — J301 Allergic rhinitis due to pollen: Secondary | ICD-10-CM

## 2023-12-20 ENCOUNTER — Other Ambulatory Visit: Payer: Self-pay | Admitting: Primary Care

## 2023-12-20 DIAGNOSIS — I10 Essential (primary) hypertension: Secondary | ICD-10-CM

## 2024-01-01 ENCOUNTER — Other Ambulatory Visit: Payer: Self-pay | Admitting: Primary Care

## 2024-01-01 DIAGNOSIS — E039 Hypothyroidism, unspecified: Secondary | ICD-10-CM

## 2024-02-09 ENCOUNTER — Other Ambulatory Visit: Payer: Self-pay | Admitting: Primary Care

## 2024-02-09 DIAGNOSIS — N898 Other specified noninflammatory disorders of vagina: Secondary | ICD-10-CM

## 2024-02-14 DIAGNOSIS — H5340 Unspecified visual field defects: Secondary | ICD-10-CM | POA: Diagnosis not present

## 2024-02-14 DIAGNOSIS — H02831 Dermatochalasis of right upper eyelid: Secondary | ICD-10-CM | POA: Diagnosis not present

## 2024-02-14 DIAGNOSIS — H02834 Dermatochalasis of left upper eyelid: Secondary | ICD-10-CM | POA: Diagnosis not present

## 2024-03-22 DIAGNOSIS — M170 Bilateral primary osteoarthritis of knee: Secondary | ICD-10-CM | POA: Diagnosis not present

## 2024-04-05 ENCOUNTER — Other Ambulatory Visit: Payer: Self-pay | Admitting: Primary Care

## 2024-04-05 DIAGNOSIS — M1990 Unspecified osteoarthritis, unspecified site: Secondary | ICD-10-CM

## 2024-04-13 DIAGNOSIS — M1712 Unilateral primary osteoarthritis, left knee: Secondary | ICD-10-CM | POA: Diagnosis not present

## 2024-04-24 DIAGNOSIS — M67462 Ganglion, left knee: Secondary | ICD-10-CM | POA: Diagnosis not present

## 2024-04-24 DIAGNOSIS — M170 Bilateral primary osteoarthritis of knee: Secondary | ICD-10-CM | POA: Diagnosis not present

## 2024-05-16 ENCOUNTER — Ambulatory Visit

## 2024-05-16 DIAGNOSIS — Z09 Encounter for follow-up examination after completed treatment for conditions other than malignant neoplasm: Secondary | ICD-10-CM | POA: Diagnosis not present

## 2024-07-10 ENCOUNTER — Other Ambulatory Visit: Payer: Self-pay | Admitting: Primary Care

## 2024-07-10 DIAGNOSIS — M1990 Unspecified osteoarthritis, unspecified site: Secondary | ICD-10-CM

## 2024-07-11 NOTE — Telephone Encounter (Signed)
Lvm and sent MyChart

## 2024-07-11 NOTE — Telephone Encounter (Signed)
 Patient is due for CPE/follow up in early March, this will be required prior to any further refills.  Please schedule, thank you!

## 2024-07-19 DIAGNOSIS — Z85828 Personal history of other malignant neoplasm of skin: Secondary | ICD-10-CM | POA: Diagnosis not present

## 2024-07-19 DIAGNOSIS — L658 Other specified nonscarring hair loss: Secondary | ICD-10-CM | POA: Diagnosis not present

## 2024-07-19 DIAGNOSIS — L821 Other seborrheic keratosis: Secondary | ICD-10-CM | POA: Diagnosis not present

## 2024-07-19 DIAGNOSIS — Z08 Encounter for follow-up examination after completed treatment for malignant neoplasm: Secondary | ICD-10-CM | POA: Diagnosis not present

## 2024-07-19 DIAGNOSIS — D1801 Hemangioma of skin and subcutaneous tissue: Secondary | ICD-10-CM | POA: Diagnosis not present

## 2024-07-20 DIAGNOSIS — M170 Bilateral primary osteoarthritis of knee: Secondary | ICD-10-CM | POA: Diagnosis not present

## 2024-07-25 ENCOUNTER — Ambulatory Visit
Admission: RE | Admit: 2024-07-25 | Discharge: 2024-07-25 | Disposition: A | Discharge status: Home / Self Care | Attending: Emergency Medicine

## 2024-07-25 VITALS — BP 126/76 | HR 79 | Temp 97.8°F | Resp 18

## 2024-07-25 DIAGNOSIS — J069 Acute upper respiratory infection, unspecified: Secondary | ICD-10-CM | POA: Diagnosis not present

## 2024-07-25 MED ORDER — AZITHROMYCIN 250 MG PO TABS: 250.0000 mg | ORAL_TABLET | Freq: Every day | ORAL | 0 refills | Status: AC

## 2024-07-25 MED ORDER — PROMETHAZINE-DM 6.25-15 MG/5ML PO SYRP: 5.0000 mL | ORAL_SOLUTION | Freq: Four times a day (QID) | ORAL | 0 refills | Status: AC | PRN

## 2024-07-25 NOTE — Discharge Instructions (Signed)
 Take the Zithromax  as directed.    Take the Promethazine  DM as directed.  Do not drive, operate machinery, drink alcohol, or perform dangerous activities while taking this medication as it may cause drowsiness.  Follow up with your primary care provider.

## 2024-07-25 NOTE — ED Provider Notes (Signed)
 Sandra [name]    CSN: [id number] Arrival date & time: 07/25/24  1504      History   Chief Complaint Chief Complaint  Patient presents with   Cough    prolonged cough and congestion - Entered by patient    HPI Sandra [name] is a 73 y.o. female.  Patient presents with congestion and cough x 3 weeks which is worse in the last week.  Her cough is keeping her awake at night.  Treatment attempted with Flonase  and Zyrtec.  No fever, chest pain, shortness of breath.  Her medical history includes seasonal allergies, chronic sinusitis, hypertension.  The history is provided by the patient and medical records.    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   Seasonal allergic rhinitis due to pollen 12/03/2021   Burning sensation of mouth 12/03/2021   Anxiety with depression 12/03/2021   Alopecia 10/02/2021   Flatulence 10/02/2021   Chronic fatigue 10/02/2021   Dry mouth 07/01/2020   Preventative health care 05/04/2018   Vaginal dryness 05/04/2018   HTN (hypertension) 04/09/2015   Arthritis 02/12/2015   Insomnia 02/12/2015   GAD (generalized anxiety disorder) 06/23/2012   Hypothyroidism 06/23/2012    Past Surgical History:  Procedure Laterality Date   CESAREAN SECTION     X 2   COLONOSCOPY WITH PROPOFOL  N/A 07/23/2020   Procedure: COLONOSCOPY WITH PROPOFOL ;  Surgeon: Janalyn Keene NOVAK, MD;  Location: [facility] ENDOSCOPY;  Service: Endoscopy;  Laterality: N/A;    OB History   No obstetric history on file.      Home Medications    Prior to Admission medications   Medication Sig Start Date End Date Taking? Authorizing Provider  azithromycin  (ZITHROMAX ) 250 MG tablet Take 1 tablet (250 mg total) by mouth daily. Take  first 2 tablets together, then 1 every day until finished. 07/25/24  Yes Corlis Burnard DEL, NP  promethazine -dextromethorphan (PROMETHAZINE -DM) 6.25-15 MG/5ML syrup Take 5 mLs by mouth 4 (four) times daily as needed. 07/25/24  Yes Corlis Burnard DEL, NP  ALLERGY RELIEF 10 MG tablet TAKE ONE TABLET BY MOUTH EVERY DAY FOR ALLERGIES 12/17/23   Clark, Katherine K, NP  calcium-vitamin D (OSCAL WITH D) 500-200 MG-UNIT tablet Take 2 tablets by mouth daily.    [provider]  estradiol  (ESTRACE ) 0.1 MG/GM vaginal cream INSERT ONE (1) TO THREE (3) GRAMS PER WEEK AS DIRECTED 02/09/24   Clark, Katherine K, NP  finasteride (PROSCAR) 5 MG tablet finasteride 5 mg tablet    [provider]  fluticasone  (FLONASE ) 50 MCG/ACT nasal spray USE 1-2 SPRAYS IN EACH NOSTRIL EVERY DAYIF NEEDED 09/01/23   Clark, Katherine K, NP  levothyroxine  (SYNTHROID ) 112 MCG tablet TAKE 1 TABLET EVERY DAY ON EMPTY STOMACHWITH A GLASS OF WATER AT LEAST 30-60 MINBEFORE BREAKFAST 01/03/24   Gretta Comer POUR, NP  losartan  (COZAAR ) 50 MG tablet TAKE 1 TABLET BY MOUTH DAILY FOR BLOOD PRESSURE 12/20/23   Clark, Katherine K, NP  meloxicam  (MOBIC ) 15 MG tablet TAKE 1 TABLET BY MOUTH DAILY AS NEEDED FOR PAIN : TAKE WITH FOOD 07/11/24   Clark, Katherine K, NP  minoxidil (LONITEN) 2.5 MG tablet Take 1.25 mg by mouth daily. 06/30/21   [provider]  mirtazapine  (REMERON   SOL-TAB) 15 MG disintegrating tablet TAKE ONE TABLET BY MOUTH AT BEDTIME FOR SLEEP. 11/23/23   Gretta Comer POUR, NP    Family History Family History  Problem Relation Age of Onset   Arthritis Mother        s/p hip replacement   Hyperlipidemia Mother    Heart disease Mother    Hypertension Mother    Arthritis Father    Hyperlipidemia Father    Heart disease Father    Hypertension Father    Dementia Father    Thrombocytopenia Son    Hearing loss Son    Thrombocytopenia Son     Social History Social History   Tobacco Use   Smoking status: Never    Smokeless tobacco: Never  Vaping Use   Vaping status: Never Used  Substance Use Topics   Alcohol use: Yes   Drug use: No     Allergies   Patient has no known allergies.   Review of Systems Review of Systems  Constitutional:  Negative for chills and fever.  HENT:  Positive for congestion and postnasal drip. Negative for ear pain and sore throat.   Respiratory:  Positive for cough. Negative for shortness of breath.      Physical Exam Triage Vital Signs ED Triage Vitals  Encounter Vitals Group     BP 07/25/24 1555 126/76     Girls Systolic BP Percentile --      Girls Diastolic BP Percentile --      Boys Systolic BP Percentile --      Boys Diastolic BP Percentile --      Pulse Rate 07/25/24 1555 79     Resp 07/25/24 1555 18     Temp 07/25/24 1555 97.8 F (36.6 C)     Temp src --      SpO2 07/25/24 1555 98 %     Weight --      Height --      Head Circumference --      Peak Flow --      Pain Score 07/25/24 1554 0     Pain Loc --      Pain Education --      Exclude from Growth Chart --    No data found.  Updated Vital Signs BP 126/76   Pulse 79   Temp 97.8 F (36.6 C)   Resp 18   SpO2 98%   Visual Acuity Right Eye Distance:   Left Eye Distance:   Bilateral Distance:    Right Eye Near:   Left Eye Near:    Bilateral Near:     Physical Exam Constitutional:      General: She is not in acute distress. HENT:     Right Ear: Tympanic membrane normal.     Left Ear: Tympanic membrane normal.     Nose: Congestion and rhinorrhea present.     Mouth/Throat:     Mouth: Mucous membranes are moist.     Pharynx: Oropharynx is clear.  Cardiovascular:     Rate and Rhythm: Normal rate and regular rhythm.     Heart sounds: Normal heart sounds.  Pulmonary:     Effort: Pulmonary effort is normal. No respiratory distress.     Breath sounds: Normal breath sounds.  Neurological:     Mental Status: She is alert.      UC Treatments / Results  Labs (all labs ordered  are listed, but only abnormal results are displayed) Labs Reviewed - No data to display  EKG   Radiology No results found.  Procedures Procedures (including critical care time)  Medications Ordered in UC Medications - No data to display  Initial Impression / Assessment and Plan / UC Course  I have reviewed the triage vital signs and the nursing notes.  Pertinent labs & imaging results that were available during my care of the patient were reviewed by me and considered in my medical decision making (see chart for details).    Acute upper respiratory infection.  Afebrile and vital signs are stable.  Lungs are clear and O2 sat is 98% on room air.  Treating today with Zithromax  per patient preference.  Treating cough with Promethazine  DM; precautions for drowsiness with this medication discussed.  Instructed her to follow-up with her PCP.  Education provided on upper respiratory infection.  Patient agrees to plan of care.  Final Clinical Impressions(s) / UC Diagnoses   Final diagnoses:  Acute upper respiratory infection     Discharge Instructions      Take the Zithromax  as directed.    Take the Promethazine  DM as directed.  Do not drive, operate machinery, drink alcohol, or perform dangerous activities while taking this medication as it may cause drowsiness.  Follow up with your primary care provider.        ED Prescriptions     Medication Sig Dispense Auth. Provider   azithromycin  (ZITHROMAX ) 250 MG tablet Take 1 tablet (250 mg total) by mouth daily. Take first 2 tablets together, then 1 every day until finished. 6 tablet Corlis Burnard DEL, NP   promethazine -dextromethorphan (PROMETHAZINE -DM) 6.25-15 MG/5ML syrup Take 5 mLs by mouth 4 (four) times daily as needed. 118 mL Corlis Burnard DEL, NP      PDMP not reviewed this encounter.   Corlis Burnard DEL, NP 07/25/24 [phone]

## 2024-07-25 NOTE — ED Triage Notes (Signed)
 Patient to Urgent Care with complaints of cough (productive this morning with green mucus). No fevers.  Symptoms started mid-November. Came back last week.   Has been taking zyrtec and flonase . One dose of sudafed.

## 2024-07-26 ENCOUNTER — Ambulatory Visit: Admitting: Primary Care

## 2024-07-28 ENCOUNTER — Telehealth: Payer: Self-pay

## 2024-07-28 MED ORDER — PREDNISONE 10 MG (21) PO TBPK: ORAL_TABLET | Freq: Every day | ORAL | 0 refills | Status: AC

## 2024-07-28 NOTE — Telephone Encounter (Signed)
 Patient contacted Urgent Care requesting prednisone  prescription discussed during visit 12/9.  Medications discussed with Burnard Cork NP.  Prescription sent to pharmacy on file.

## 2024-08-03 ENCOUNTER — Other Ambulatory Visit: Payer: Self-pay | Admitting: Primary Care

## 2024-08-03 DIAGNOSIS — J301 Allergic rhinitis due to pollen: Secondary | ICD-10-CM

## 2024-08-19 ENCOUNTER — Other Ambulatory Visit: Payer: Self-pay | Admitting: Primary Care

## 2024-08-19 DIAGNOSIS — F418 Other specified anxiety disorders: Secondary | ICD-10-CM

## 2024-09-07 ENCOUNTER — Other Ambulatory Visit: Payer: Self-pay | Admitting: Primary Care

## 2024-09-07 DIAGNOSIS — I10 Essential (primary) hypertension: Secondary | ICD-10-CM

## 2024-09-08 ENCOUNTER — Ambulatory Visit

## 2024-09-08 VITALS — Ht 63.0 in | Wt 106.0 lb

## 2024-09-08 DIAGNOSIS — Z0000 Encounter for general adult medical examination without abnormal findings: Secondary | ICD-10-CM

## 2024-09-08 NOTE — Patient Instructions (Signed)
 Sandra [name],  Thank you for taking the time for your Medicare Wellness Visit. I appreciate your continued commitment to your health goals. Please review the care plan we discussed, and feel free to reach out if I can assist you further.  Please note that Annual Wellness Visits do not include a physical exam. Some assessments may be limited, especially if the visit was conducted virtually. If needed, we may recommend an in-person follow-up with your provider.  Ongoing Care Seeing your primary care provider every 3 to 6 months helps us  monitor your health and provide consistent, personalized care.   Referrals If a referral was made during today's visit and you haven't received any updates within two weeks, please contact the referred provider directly to check on the status.  Recommended Screenings:  Health Maintenance  Topic Date Due   Zoster (Shingles) Vaccine (1 of 2) Never done   Medicare Annual Wellness Visit  06/16/2023   Flu Shot  03/17/2024   Breast Cancer Screening  07/20/2025   Colon Cancer Screening  07/23/2025   DTaP/Tdap/Td vaccine (2 - Td or Tdap) 04/16/2026   Pneumococcal Vaccine for age over 51  Completed   Osteoporosis screening with Bone Density Scan  Completed   Hepatitis C Screening  Completed   Meningitis B Vaccine  Aged Out   COVID-19 Vaccine  Discontinued       09/08/2024   11:37 AM  Advanced Directives  Does Patient Have a Medical Advance Directive? Yes  Type of Advance Directive Living will;Healthcare Power of Attorney  Does patient want to make changes to medical advance directive? No - Patient declined  Copy of Healthcare Power of Attorney in Chart? No - copy requested    Vision: Annual vision screenings are recommended for early detection of glaucoma, cataracts, and diabetic retinopathy. These exams can also reveal signs of chronic conditions such as diabetes and high blood pressure.  Dental: Annual dental screenings help detect early signs of oral  cancer, gum disease, and other conditions linked to overall health, including heart disease and diabetes.  Please see the attached documents for additional preventive care recommendations.

## 2024-09-08 NOTE — Progress Notes (Signed)
 "  Chief Complaint  Patient presents with   Medicare Wellness     Subjective:   Sandra [name] is a 74 y.o. female who presents for a Medicare Annual Wellness Visit.  Visit info / Clinical Intake: Medicare Wellness Visit Type:: Subsequent Annual Wellness Visit Persons participating in visit and providing information:: patient Medicare Wellness Visit Mode:: Telephone If telephone:: video declined Since this visit was completed virtually, some vitals may be partially provided or unavailable. Missing vitals are due to the limitations of the virtual format.: Unable to obtain vitals - no equipment If Telephone or Video please confirm:: I connected with patient using audio/video enable telemedicine. I verified patient identity with two identifiers, discussed telehealth limitations, and patient agreed to proceed. Patient Location:: home Provider Location:: home office Interpreter Needed?: No Pre-visit prep was completed: yes AWV questionnaire completed by patient prior to visit?: no Living arrangements:: lives with spouse/significant other Patient's Overall Health Status Rating: good Typical amount of pain: some Does pain affect daily life?: no Are you currently prescribed opioids?: no  Dietary Habits and Nutritional Risks How many meals a day?: 2 Eats fruit and vegetables daily?: yes Most meals are obtained by: preparing own meals; eating out In the last 2 weeks, have you had any of the following?: none Diabetic:: no  Functional Status Activities of Daily Living (to include ambulation/medication): Independent Ambulation: Independent Medication Administration: Independent Home Management (perform basic housework or laundry): Independent Manage your own finances?: yes Primary transportation is: driving Concerns about vision?: no *vision screening is required for WTM* Concerns about hearing?: no  Fall Screening Falls in the past year?: 0 Number of falls in past year: 0 Was  there an injury with Fall?: 0 Fall Risk Category Calculator: 0 Patient Fall Risk Level: Low Fall Risk  Fall Risk Patient at Risk for Falls Due to: No Fall Risks Fall risk Follow up: Falls evaluation completed; Education provided; Falls prevention discussed  Home and Transportation Safety: All rugs have non-skid backing?: yes All stairs or steps have railings?: N/A, no stairs Grab bars in the bathtub or shower?: (!) no Have non-skid surface in bathtub or shower?: yes Good home lighting?: yes Regular seat belt use?: yes Hospital stays in the last year:: no  Cognitive Assessment Difficulty concentrating, remembering, or making decisions? : no Will 6CIT or Mini Cog be Completed: yes What year is it?: 0 points What month is it?: 0 points Give patient an address phrase to remember (5 components): [address] [place]  About what time is it?: 0 points Count backwards from 20 to 1: 0 points Say the months of the year in reverse: 0 points Repeat the address phrase from earlier: 0 points 6 CIT Score: 0 points  Advance Directives (For Healthcare) Does Patient Have a Medical Advance Directive?: Yes Does patient want to make changes to medical advance directive?: No - Patient declined Type of Advance Directive: Living will; Healthcare Power of Attorney Copy of Healthcare Power of Attorney in Chart?: No - copy requested Copy of Living Will in Chart?: No - copy requested  Reviewed/Updated  Reviewed/Updated: Reviewed All (Medical, Surgical, Family, Medications, Allergies, Care Teams, Patient Goals)    Allergies (verified) Patient has no known allergies.   Current Medications (verified) Outpatient Encounter Medications as of 09/08/2024  Medication Sig   ALLERGY RELIEF 10 MG tablet TAKE ONE TABLET BY MOUTH EVERY DAY FOR ALLERGIES   calcium-vitamin D (OSCAL WITH D) 500-200 MG-UNIT tablet Take 2 tablets by mouth daily.   estradiol  (  ESTRACE ) 0.1 MG/GM vaginal cream INSERT ONE (1)  TO THREE (3) GRAMS PER WEEK AS DIRECTED   finasteride (PROSCAR) 5 MG tablet finasteride 5 mg tablet   fluticasone  (FLONASE ) 50 MCG/ACT nasal spray USE 1-2 SPRAYS IN EACH NOSTRIL EVERY DAYIF NEEDED   levothyroxine  (SYNTHROID ) 112 MCG tablet TAKE 1 TABLET EVERY DAY ON EMPTY STOMACHWITH A GLASS OF WATER AT LEAST 30-60 MINBEFORE BREAKFAST   losartan  (COZAAR ) 50 MG tablet TAKE 1 TABLET BY MOUTH DAILY FOR BLOOD PRESSURE   meloxicam  (MOBIC ) 15 MG tablet TAKE 1 TABLET BY MOUTH DAILY AS NEEDED FOR PAIN : TAKE WITH FOOD   minoxidil (LONITEN) 2.5 MG tablet Take 1.25 mg by mouth daily.   mirtazapine  (REMERON  SOL-TAB) 15 MG disintegrating tablet TAKE ONE TABLET BY MOUTH AT BEDTIME FOR SLEEP.   azithromycin  (ZITHROMAX ) 250 MG tablet Take 1 tablet (250 mg total) by mouth daily. Take first 2 tablets together, then 1 every day until finished.   predniSONE  (STERAPRED UNI-PAK 21 TAB) 10 MG (21) TBPK tablet Take by mouth daily. Take 6 tabs by mouth daily  for 2 days, then 5 tabs for 2 days, then 4 tabs for 2 days, then 3 tabs for 2 days, 2 tabs for 2 days, then 1 tab by mouth daily for 2 days   promethazine -dextromethorphan (PROMETHAZINE -DM) 6.25-15 MG/5ML syrup Take 5 mLs by mouth 4 (four) times daily as needed.   No facility-administered encounter medications on file as of 09/08/2024.    History: Past Medical History:  Diagnosis Date   Allergy    Arthritis    COVID-19 virus infection 09/26/2021   Herpes zoster 05/24/2019   Hypertension    Loose stools 10/02/2021   Thyroid  disease    Past Surgical History:  Procedure Laterality Date   CESAREAN SECTION     X 2   COLONOSCOPY WITH PROPOFOL  N/A 07/23/2020   Procedure: COLONOSCOPY WITH PROPOFOL ;  Surgeon: Janalyn Keene NOVAK, MD;  Location: [facility] ENDOSCOPY;  Service: Endoscopy;  Laterality: N/A;   Family History  Problem Relation Age of Onset   Arthritis Mother        s/p hip replacement   Hyperlipidemia Mother    Heart disease Mother    Hypertension  Mother    Arthritis Father    Hyperlipidemia Father    Heart disease Father    Hypertension Father    Dementia Father    Thrombocytopenia Son    Hearing loss Son    Kidney disease Son    Hearing loss Son    Thrombocytopenia Son    Social History   Occupational History   Not on file  Tobacco Use   Smoking status: Never   Smokeless tobacco: Never  Vaping Use   Vaping status: Never Used  Substance and Sexual Activity   Alcohol use: Yes   Drug use: No   Sexual activity: Not on file   Tobacco Counseling Counseling given: Not Answered  SDOH Screenings   Food Insecurity: No Food Insecurity (09/08/2024)  Housing: Low Risk (09/08/2024)  Transportation Needs: No Transportation Needs (09/08/2024)  Utilities: Not At Risk (09/08/2024)  Alcohol Screen: Low Risk (09/08/2024)  Depression (PHQ2-9): Low Risk (09/08/2024)  Financial Resource Strain: Low Risk (09/08/2024)  Physical Activity: Insufficiently Active (09/08/2024)  Social Connections: Socially Integrated (09/08/2024)  Stress: No Stress Concern Present (09/08/2024)  Tobacco Use: Low Risk (09/08/2024)  Health Literacy: Adequate Health Literacy (09/08/2024)   See flowsheets for full screening details  Depression Screen PHQ 2 & 9 Depression Scale- Over  the past 2 weeks, how often have you been bothered by any of the following problems? Little interest or pleasure in doing things: 0 Feeling down, depressed, or hopeless (PHQ Adolescent also includes...irritable): 0 PHQ-2 Total Score: 0 Trouble falling or staying asleep, or sleeping too much: 0 Feeling tired or having little energy: 0 Poor appetite or overeating (PHQ Adolescent also includes...weight loss): 0 Feeling bad about yourself - or that you are a failure or have let yourself or your family down: 0 Trouble concentrating on things, such as reading the newspaper or watching television (PHQ Adolescent also includes...like school work): 0 Moving or speaking so slowly that other  people could have noticed. Or the opposite - being so fidgety or restless that you have been moving around a lot more than usual: 0 Thoughts that you would be better off dead, or of hurting yourself in some way: 0 PHQ-9 Total Score: 0 If you checked off any problems, how difficult have these problems made it for you to do your work, take care of things at home, or get along with other people?: Not difficult at all     Goals Addressed             This Visit's Progress    I want to get more information about kidney disease to help my son       Patient Stated   On track    05/18/2019, Patient states that she wants to exercise more daily.      Patient Stated   On track    06/15/2022, exercise and healthy eating             Objective:    Today's Vitals   09/08/24 1132  Weight: 106 lb (48.1 kg)  Height: 5' 3 (1.6 m)   Body mass index is 18.78 kg/m.  Hearing/Vision screen No results found. Immunizations and Health Maintenance Health Maintenance  Topic Date Due   Zoster Vaccines- Shingrix (1 of 2) Never done   Medicare Annual Wellness (AWV)  06/16/2023   Influenza Vaccine  03/17/2024   Mammogram  07/20/2025   Colonoscopy  07/23/2025   DTaP/Tdap/Td (2 - Td or Tdap) 04/16/2026   Pneumococcal Vaccine: 50+ Years  Completed   Bone Density Scan  Completed   Hepatitis C Screening  Completed   Meningococcal B Vaccine  Aged Out   COVID-19 Vaccine  Discontinued        Assessment/Plan:  This is a routine wellness examination for [place].  Patient Care Team: Gretta Comer POUR, NP as PCP - General (Internal Medicine) Ortho, [facility] (Specialist)  I have personally reviewed and noted the following in the patients chart:   Medical and social history Use of alcohol, tobacco or illicit drugs  Current medications and supplements including opioid prescriptions. Functional ability and status Nutritional status Physical activity Advanced directives List of other  physicians Hospitalizations, surgeries, and ER visits in previous 12 months Vitals Screenings to include cognitive, depression, and falls Referrals and appointments  No orders of the defined types were placed in this encounter.  In addition, I have reviewed and discussed with patient certain preventive protocols, quality metrics, and best practice recommendations. A written personalized care plan for preventive services as well as general preventive health recommendations were provided to patient.   Erminio LITTIE Saris, LPN   [id number]    After Visit Summary: (MyChart) Due to this being a telephonic visit, the after visit summary with patients personalized plan was offered to patient via MyChart  Nurse Notes: No voiced or noted concerns at this time Patient advised to keep follow-up appointment with PCP (10/13/24 CPE) Vaccines not given: Influenza.Shingrix declined today  "

## 2024-10-13 ENCOUNTER — Encounter: Admitting: Primary Care
